# Patient Record
Sex: Male | Born: 1980 | Race: Black or African American | Hispanic: No | Marital: Married | Smoking: Never smoker
Health system: Southern US, Community
[De-identification: ages and names within clinical notes are randomized; demographics above are authoritative.]

---

## 2017-09-24 ENCOUNTER — Other Ambulatory Visit: Payer: Self-pay

## 2017-09-24 ENCOUNTER — Encounter (HOSPITAL_COMMUNITY): Payer: Self-pay | Admitting: Emergency Medicine

## 2017-09-24 ENCOUNTER — Ambulatory Visit (HOSPITAL_COMMUNITY)
Admission: EM | Admit: 2017-09-24 | Discharge: 2017-09-24 | Disposition: A | Discharge status: Home / Self Care | Payer: Medicaid Other | Attending: Family Medicine | Admitting: Family Medicine

## 2017-09-24 DIAGNOSIS — R51 Headache: Secondary | ICD-10-CM | POA: Diagnosis not present

## 2017-09-24 DIAGNOSIS — R1011 Right upper quadrant pain: Secondary | ICD-10-CM

## 2017-09-24 DIAGNOSIS — G8929 Other chronic pain: Secondary | ICD-10-CM | POA: Insufficient documentation

## 2017-09-24 DIAGNOSIS — R109 Unspecified abdominal pain: Secondary | ICD-10-CM | POA: Diagnosis present

## 2017-09-24 LAB — COMPREHENSIVE METABOLIC PANEL
ALBUMIN: 4.6 g/dL (ref 3.5–5.0)
ALT: 45 U/L — ABNORMAL HIGH (ref 0–44)
AST: 37 U/L (ref 15–41)
Alkaline Phosphatase: 66 U/L (ref 38–126)
Anion gap: 9 (ref 5–15)
BUN: 8 mg/dL (ref 6–20)
CHLORIDE: 99 mmol/L (ref 98–111)
CO2: 30 mmol/L (ref 22–32)
Calcium: 9.7 mg/dL (ref 8.9–10.3)
Creatinine, Ser: 0.89 mg/dL (ref 0.61–1.24)
GFR calc Af Amer: >60 mL/min (ref >60)
GFR calc non Af Amer: >60 mL/min (ref >60)
GLUCOSE: 111 mg/dL — AB (ref 70–99)
POTASSIUM: 4.2 mmol/L (ref 3.5–5.1)
SODIUM: 138 mmol/L (ref 135–145)
Total Bilirubin: 1 mg/dL (ref 0.3–1.2)
Total Protein: 7.6 g/dL (ref 6.5–8.1)

## 2017-09-24 LAB — CBC
HCT: 48.7 % (ref 39.0–52.0)
Hemoglobin: 16.5 g/dL (ref 13.0–17.0)
MCH: 28.8 pg (ref 26.0–34.0)
MCHC: 33.9 g/dL (ref 30.0–36.0)
MCV: 85.1 fL (ref 78.0–100.0)
PLATELETS: 319 10*3/uL (ref 150–400)
RBC: 5.72 MIL/uL (ref 4.22–5.81)
RDW: 12.3 % (ref 11.5–15.5)
WBC: 7.5 10*3/uL (ref 4.0–10.5)

## 2017-09-24 LAB — POCT URINALYSIS DIP (DEVICE)
BILIRUBIN URINE: NEGATIVE
Glucose, UA: NEGATIVE mg/dL
HGB URINE DIPSTICK: NEGATIVE
KETONES UR: NEGATIVE mg/dL
Leukocytes, UA: NEGATIVE
Nitrite: NEGATIVE
Protein, ur: NEGATIVE mg/dL
SPECIFIC GRAVITY, URINE: 1.015 (ref 1.005–1.030)
Urobilinogen, UA: 0.2 mg/dL (ref 0.0–1.0)
pH: 7.5 (ref 5.0–8.0)

## 2017-09-24 LAB — LIPASE, BLOOD: LIPASE: 36 U/L (ref 11–51)

## 2017-09-24 MED ORDER — OMEPRAZOLE 20 MG PO CPDR: 20.0000 mg | DELAYED_RELEASE_CAPSULE | Freq: Every day | ORAL | 0 refills | Status: DC

## 2017-09-24 NOTE — ED Provider Notes (Signed)
[place]    CSN: [id number] Arrival date & time: 09/24/17  1528     History   Chief Complaint Chief Complaint  Patient presents with  . Abdominal Pain    HPI Patrick Reed is a 37 y.o. male no significant past medical history presenting today for evaluation of right-sided abdominal pain.  Patient states that he has had this abdominal pain off and on for the past 10 years while he was in [place], recently emigrated over here 1 month ago, over this past month he has had worsening/more persistent pain.  Denies associated nausea or vomiting.  Denies changes in bowel movements.  Eating and drinking like normally.  Denies any shortness of breath or chest pain.  Noticed the pain worsening with movement.  States that he feels the pain underneath his rib cage.  Denies coughing or recent illness.  Occasionally will have headaches.  Denies urinary symptoms of dysuria or increased frequency.  He states in [place] they told him he had a fatty liver, but he is unsure about this.  HPI  History reviewed. No pertinent past medical history.  There are no active problems to display for this patient.   History reviewed. No pertinent surgical history.     Home Medications    Prior to Admission medications   Medication Sig Start Date End Date Taking? Authorizing Provider  omeprazole (PRILOSEC) 20 MG capsule Take 1 capsule (20 mg total) by mouth daily for 14 days. 09/24/17 10/08/17  Anay Rathe, Elesa Hacker, PA-C    Family History History reviewed. No pertinent family history.  Social History Social History   Tobacco Use  . Smoking status: Never Smoker  . Smokeless tobacco: Never Used  Substance Use Topics  . Alcohol use: Never    Frequency: Never  . Drug use: Never     Allergies   Patient has no known allergies.   Review of Systems Review of Systems  Constitutional: Negative for activity change, appetite change, chills, fatigue and fever.  HENT: Negative for congestion, ear  pain, rhinorrhea, sinus pressure, sore throat and trouble swallowing.   Eyes: Negative for discharge and redness.  Respiratory: Negative for cough, chest tightness and shortness of breath.   Cardiovascular: Negative for chest pain.  Gastrointestinal: Positive for abdominal pain. Negative for diarrhea, nausea and vomiting.  Genitourinary: Negative for dysuria, frequency, hematuria and urgency.  Musculoskeletal: Negative for myalgias.  Skin: Negative for rash.  Neurological: Positive for headaches. Negative for dizziness and light-headedness.     Physical Exam Triage Vital Signs ED Triage Vitals  Enc Vitals Group     BP 09/24/17 1544 133/77     Pulse Rate 09/24/17 1544 88     Resp 09/24/17 1544 18     Temp 09/24/17 1544 98 F (36.7 C)     Temp Source 09/24/17 1544 Oral     SpO2 09/24/17 1544 100 %     Weight --      Height --      Head Circumference --      Peak Flow --      Pain Score 09/24/17 1543 4     Pain Loc --      Pain Edu? --      Excl. in [place]? --    No data found.  Updated Vital Signs BP 133/77 (BP Location: Left Arm)   Pulse 88   Temp 98 F (36.7 C) (Oral)   Resp 18   SpO2 100%   Visual Acuity Right  Eye Distance:   Left Eye Distance:   Bilateral Distance:    Right Eye Near:   Left Eye Near:    Bilateral Near:     Physical Exam  Constitutional: He appears well-developed and well-nourished.  HENT:  Head: Normocephalic and atraumatic.  Mouth/Throat: Oropharynx is clear and moist.  Eyes: Pupils are equal, round, and reactive to light. Conjunctivae and EOM are normal.  Neck: Neck supple.  Cardiovascular: Normal rate and regular rhythm.  No murmur heard. Pulmonary/Chest: Effort normal and breath sounds normal. No respiratory distress.  Abdominal: Soft. There is no tenderness.  Mild tenderness to right flank/right upper quadrant, negative Murphy's, negative McBurney's, negative Rovsing, negative rebound.  Musculoskeletal: He exhibits no edema.    Neurological: He is alert.  Skin: Skin is warm and dry.  Psychiatric: He has a normal mood and affect.  Nursing note and vitals reviewed.    UC Treatments / Results  Labs (all labs ordered are listed, but only abnormal results are displayed) Labs Reviewed  CBC  COMPREHENSIVE METABOLIC PANEL  LIPASE, BLOOD  HEPATITIS PANEL, ACUTE  POCT URINALYSIS DIP (DEVICE)    EKG None  Radiology No results found.  Procedures Procedures (including critical care time)  Medications Ordered in UC Medications - No data to display  Initial Impression / Assessment and Plan / UC Course  I have reviewed the triage vital signs and the nursing notes.  Pertinent labs & imaging results that were available during my care of the patient were reviewed by me and considered in my medical decision making (see chart for details).     Patient with chronic abdominal pain, vital signs stable, exam unremarkable.  Possible liver/gallbladder etiology.  UA negative.  Will draw blood to check for LFTs, lipase.  Discussed with patient about establishing care with a PCP for further evaluation and imaging of this area.  Will call patient with lab results if abnormal.  We will do a trial of Prilosec for the next couple weeks to see if pain is reflux related.  Discussed strict return precautions. Patient verbalized understanding and is agreeable with plan.  Final Clinical Impressions(s) / UC Diagnoses   Final diagnoses:  Right upper quadrant abdominal pain     Discharge Instructions     Please begin prilosec daily for the next 2 weeks  Follow up with community health and wellness or gastroenterology for further evaluation  Return if pain worsening, developing nausea, and vomiting, changes in bowels, fever    ED Prescriptions    Medication Sig Dispense Auth. Provider   omeprazole (PRILOSEC) 20 MG capsule Take 1 capsule (20 mg total) by mouth daily for 14 days. 14 capsule Suellyn Meenan C, PA-C      Controlled Substance Prescriptions [place] Controlled Substance Registry consulted? Not Applicable   Janith Lima, [place] 09/24/17 1721

## 2017-09-24 NOTE — Discharge Instructions (Addendum)
Please begin prilosec daily for the next 2 weeks  Follow up with community health and wellness or gastroenterology for further evaluation  Return if pain worsening, developing nausea, and vomiting, changes in bowels, fever

## 2017-09-24 NOTE — ED Triage Notes (Signed)
The patient presented to the [facility] with a complaint of right side abdominal pain x 1 month.

## 2017-09-25 LAB — HEPATITIS PANEL, ACUTE
HCV Ab: <0.1 s/co ratio (ref 0.0–0.9)
HEP B S AG: NEGATIVE
Hep A IgM: NEGATIVE
Hep B C IgM: NEGATIVE

## 2017-09-30 ENCOUNTER — Telehealth (HOSPITAL_COMMUNITY): Payer: Self-pay

## 2017-09-30 NOTE — Telephone Encounter (Signed)
Per [facility], no further instructions continue with plan of care to establish PCP. Labs are normal. Attempted to reach patient. No answer at this time.

## 2017-10-24 DIAGNOSIS — Z0000 Encounter for general adult medical examination without abnormal findings: Secondary | ICD-10-CM | POA: Diagnosis not present

## 2017-10-24 DIAGNOSIS — Z23 Encounter for immunization: Secondary | ICD-10-CM | POA: Diagnosis not present

## 2017-10-24 DIAGNOSIS — Z049 Encounter for examination and observation for unspecified reason: Secondary | ICD-10-CM | POA: Diagnosis not present

## 2017-10-24 DIAGNOSIS — Z3009 Encounter for other general counseling and advice on contraception: Secondary | ICD-10-CM | POA: Diagnosis not present

## 2017-10-24 DIAGNOSIS — Z206 Contact with and (suspected) exposure to human immunodeficiency virus [HIV]: Secondary | ICD-10-CM | POA: Diagnosis not present

## 2017-10-24 DIAGNOSIS — Z1388 Encounter for screening for disorder due to exposure to contaminants: Secondary | ICD-10-CM | POA: Diagnosis not present

## 2017-10-24 DIAGNOSIS — Z111 Encounter for screening for respiratory tuberculosis: Secondary | ICD-10-CM | POA: Diagnosis not present

## 2017-10-24 DIAGNOSIS — Z0289 Encounter for other administrative examinations: Secondary | ICD-10-CM | POA: Diagnosis not present

## 2017-10-24 DIAGNOSIS — Z0389 Encounter for observation for other suspected diseases and conditions ruled out: Secondary | ICD-10-CM | POA: Diagnosis not present

## 2017-10-29 ENCOUNTER — Encounter: Payer: Self-pay | Admitting: Physician Assistant

## 2017-10-30 ENCOUNTER — Other Ambulatory Visit: Payer: Self-pay

## 2017-10-30 ENCOUNTER — Emergency Department (HOSPITAL_COMMUNITY)
Admission: EM | Admit: 2017-10-30 | Discharge: 2017-10-30 | Disposition: A | Discharge status: Home / Self Care | Payer: Medicaid Other | Attending: Emergency Medicine | Admitting: Emergency Medicine

## 2017-10-30 DIAGNOSIS — R1031 Right lower quadrant pain: Secondary | ICD-10-CM | POA: Insufficient documentation

## 2017-10-30 DIAGNOSIS — G8929 Other chronic pain: Secondary | ICD-10-CM | POA: Diagnosis not present

## 2017-10-30 DIAGNOSIS — R109 Unspecified abdominal pain: Secondary | ICD-10-CM

## 2017-10-30 LAB — CBC
HEMATOCRIT: 47.8 % (ref 39.0–52.0)
Hemoglobin: 15.8 g/dL (ref 13.0–17.0)
MCH: 28.6 pg (ref 26.0–34.0)
MCHC: 33.1 g/dL (ref 30.0–36.0)
MCV: 86.4 fL (ref 78.0–100.0)
Platelets: 347 10*3/uL (ref 150–400)
RBC: 5.53 MIL/uL (ref 4.22–5.81)
RDW: 12.3 % (ref 11.5–15.5)
WBC: 8.1 10*3/uL (ref 4.0–10.5)

## 2017-10-30 LAB — COMPREHENSIVE METABOLIC PANEL
ALT: 67 U/L — ABNORMAL HIGH (ref 0–44)
AST: 41 U/L (ref 15–41)
Albumin: 4.5 g/dL (ref 3.5–5.0)
Alkaline Phosphatase: 74 U/L (ref 38–126)
Anion gap: 8 (ref 5–15)
BILIRUBIN TOTAL: 0.8 mg/dL (ref 0.3–1.2)
BUN: 10 mg/dL (ref 6–20)
CO2: 28 mmol/L (ref 22–32)
Calcium: 9.6 mg/dL (ref 8.9–10.3)
Chloride: 102 mmol/L (ref 98–111)
Creatinine, Ser: 0.82 mg/dL (ref 0.61–1.24)
GFR calc Af Amer: >60 mL/min (ref >60)
GFR calc non Af Amer: >60 mL/min (ref >60)
Glucose, Bld: 99 mg/dL (ref 70–99)
POTASSIUM: 4.3 mmol/L (ref 3.5–5.1)
Sodium: 138 mmol/L (ref 135–145)
TOTAL PROTEIN: 7.7 g/dL (ref 6.5–8.1)

## 2017-10-30 LAB — URINALYSIS, ROUTINE W REFLEX MICROSCOPIC
BILIRUBIN URINE: NEGATIVE
GLUCOSE, UA: NEGATIVE mg/dL
HGB URINE DIPSTICK: NEGATIVE
KETONES UR: NEGATIVE mg/dL
Leukocytes, UA: NEGATIVE
Nitrite: NEGATIVE
PH: 6 (ref 5.0–8.0)
Protein, ur: NEGATIVE mg/dL
Specific Gravity, Urine: 1.024 (ref 1.005–1.030)

## 2017-10-30 LAB — LIPASE, BLOOD: Lipase: 44 U/L (ref 11–51)

## 2017-10-30 NOTE — Discharge Instructions (Addendum)
Please read attached information. If you experience any new or worsening signs or symptoms please return to the emergency room for evaluation. Please follow-up with your primary care provider or specialist as discussed.  °

## 2017-10-30 NOTE — Congregational Nurse Program (Signed)
Congregational Nurse Program Note  Date of Encounter: 10/30/2017  Past Medical History: No past medical history on file.  Encounter Details: CNP Questionnaire - 10/30/17 1145      Questionnaire   Patient Status  Refugee    Race  African    Location Patient Served At  [place]  Medicaid    Uninsured  Not Applicable    Food  No food insecurities    Housing/Utilities  Yes, have permanent housing    Transportation  Yes, need transportation assistance    Interpersonal Safety  Yes, feel physically and emotionally safe where you currently live    Medication  No medication insecurities    Medical Provider  No    Referrals  Primary Care Provider/Clinic;Emergency Department    ED Visit Averted  Not Applicable    Life-Saving Intervention Made  Not Applicable       Initial encounter for this African man from [place] in [place]. two months with family of spouse and children. Visited nurse office at [facility] during daily English class today seeking help with ongoing abdominal pain progressively worse since Urgent Care visit on 09/24/17. Capable of reporting concerns in [place]. Stressed appearance with pain. Describes pain at level 6-8 constantly and during sleep. Pain radiates from right lower abdomen to upper right abdomen and around to lower back. Unable to stand long periods without severe pain. Experiencing frequent gas and belching. Denies other problems. Awaiting PCP appointment. Scheduled for evaluation by PA at [facility] 11/20/17 at 9:45 am.

## 2017-10-30 NOTE — ED Triage Notes (Signed)
Pt to ER for evaluation of right lower abdominal pain x "years" states has become worse. States no difficulty with urination or BM. Pt in NAD.

## 2017-10-30 NOTE — ED Provider Notes (Signed)
[place] EMERGENCY DEPARTMENT Provider Note   CSN: [id number] Arrival date & time: 10/30/17  1251     History   Chief Complaint Chief Complaint  Patient presents with  . Abdominal Pain    HPI Patrick Reed is a 37 y.o. male.  HPI   37 year old male presents today with complaints of chronic abdominal pain. Patient notes several years of right lower quadrant abdominal pain. Patient notes that he has been seen several times for this with endoscopy ultrasound and laboratory analysis. Patient notes he has had elevations in his liver function tests and was told he had fatty liver disease. Patient notes that symptoms have persisted, continued in characteristics withburning pain in the right lower quadrant. Patient denies any nausea vomiting or fever, denies any upper abdominal pain, denies any  Urinary or bowel complaints, no dark or bloody stools. Patient has a follow-up with primary care next week.     No past medical history on file.  There are no active problems to display for this patient.   No past surgical history on file.      Home Medications    Prior to Admission medications   Medication Sig Start Date End Date Taking? Authorizing Provider  omeprazole (PRILOSEC) 20 MG capsule Take 1 capsule (20 mg total) by mouth daily for 14 days. 09/24/17 10/30/17 Yes Wieters, Elesa Hacker, PA-C    Family History No family history on file.  Social History Social History   Tobacco Use  . Smoking status: Never Smoker  . Smokeless tobacco: Never Used  Substance Use Topics  . Alcohol use: Never    Frequency: Never  . Drug use: Never     Allergies   Patient has no known allergies.   Review of Systems Review of Systems  All other systems reviewed and are negative.  Physical Exam Updated Vital Signs BP 140/90 (BP Location: Right Arm)   Pulse 62   Temp 98.4 F (36.9 C) (Oral)   Resp 16   SpO2 99%   Physical Exam  Constitutional: He is oriented to  person, place, and time. He appears well-developed and well-nourished.  HENT:  Head: Normocephalic and atraumatic.  Eyes: Pupils are equal, round, and reactive to light. Conjunctivae are normal. Right eye exhibits no discharge. Left eye exhibits no discharge. No scleral icterus.  Neck: Normal range of motion. No JVD present. No tracheal deviation present.  Pulmonary/Chest: Effort normal. No stridor.  Abdominal: Soft. He exhibits no distension and no mass. There is tenderness. There is no rebound and no guarding. No hernia.  TTP right lower quadrant   Neurological: He is alert and oriented to person, place, and time. Coordination normal.  Psychiatric: He has a normal mood and affect. His behavior is normal. Judgment and thought content normal.  Nursing note and vitals reviewed.    ED Treatments / Results  Labs (all labs ordered are listed, but only abnormal results are displayed) Labs Reviewed  COMPREHENSIVE METABOLIC PANEL - Abnormal; Notable for the following components:      Result Value   ALT 67 (*)    All other components within normal limits  LIPASE, BLOOD  CBC  URINALYSIS, ROUTINE W REFLEX MICROSCOPIC    EKG None  Radiology No results found.  Procedures Procedures (including critical care time)  Medications Ordered in ED Medications - No data to display   Initial Impression / Assessment and Plan / ED Course  I have reviewed the triage vital signs and the nursing  notes.  Pertinent labs & imaging results that were available during my care of the patient were reviewed by me and considered in my medical decision making (see chart for details).     Labs: lipase, CMP, CBC, urinalysis  Imaging:  Consults:  Therapeutics:  Discharge Meds:   Assessment/Plan: 37 year old male presents today with chronic lower abdominal pain. Uncertain etiology at this time. He has no signs of infectious etiology was suspicion for acute appendicitis, diverticulitis or any other  acute life-threatening intra-abdominal pathology. Patient is on he had significant workup in the past, I will refer him to gastroenterology for ongoing symptoms. Patient does have follow-up with primary care next week. Return precautions given. Patient verbalized understanding and agreement to today's plan.      Final Clinical Impressions(s) / ED Diagnoses   Final diagnoses:  Chronic abdominal pain    ED Discharge Orders    None       Francee Gentile 10/30/17 1635    Hayden Rasmussen, MD 10/31/17 [phone]

## 2017-11-05 ENCOUNTER — Ambulatory Visit (INDEPENDENT_AMBULATORY_CARE_PROVIDER_SITE_OTHER): Payer: Medicaid Other | Admitting: Family Medicine

## 2017-11-05 ENCOUNTER — Other Ambulatory Visit: Payer: Self-pay

## 2017-11-05 VITALS — BP 122/86 | HR 70 | Temp 98.2°F | Ht 66.0 in | Wt 160.0 lb

## 2017-11-05 DIAGNOSIS — H6123 Impacted cerumen, bilateral: Secondary | ICD-10-CM | POA: Diagnosis not present

## 2017-11-05 DIAGNOSIS — Z603 Acculturation difficulty: Secondary | ICD-10-CM | POA: Diagnosis not present

## 2017-11-05 DIAGNOSIS — Z0289 Encounter for other administrative examinations: Secondary | ICD-10-CM | POA: Diagnosis not present

## 2017-11-05 DIAGNOSIS — R1084 Generalized abdominal pain: Secondary | ICD-10-CM | POA: Diagnosis not present

## 2017-11-05 MED ORDER — CARBAMIDE PEROXIDE 6.5 % OT SOLN: 5.0000 [drp] | Freq: Two times a day (BID) | OTIC | 0 refills | Status: DC

## 2017-11-05 NOTE — Patient Instructions (Signed)
It was a pleasure to see you today! Thank you for choosing [facility] for your primary care.   Please come back to see [place] for your followup appt.    You can use these ear drops we prescribed to try and soften your ear wax, please do not stick any hard objects in your ear.  We will try to get your medical records from the health clinic.  Please call us next week and ask the office staff if  has declared which medicaid plans they will take.    Please bring all your medications to every doctors visit   Please check-out at the front desk before leaving the clinic.     Best,  Dr. Sherene Sires FAMILY MEDICINE RESIDENT - PGY2 11/05/2017 4:05 PM

## 2017-11-05 NOTE — Progress Notes (Signed)
Patient declined interpretor.  [place] Patient Visit  HPI:  Patient presents to [facility] today for a new patient appointment to establish general primary care, also to discuss right sided abdominal.  Will see GI on 28th  Abdominal:occasional 10/10, if he sleeps on his right side it sometimes feels better.  Reports a prior endoscopy and 2 CT's (pre- 2011) all with no findings other than fatty liver that he says was treated with a medicine he can't remember  ROS: otherwise see HPI  Past Medical Hx:  -omeprazole for 2wks, did not help rigth sided belly pain.  Was taking a medicine he doesn't remember the name of in [place] but it didn't help much either.  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  no  - if yes:  Are you having bad dreams about your experience? Used to, but now focuses on other thins     Do you feel "jumpy" or "nervous?" no      Do you feel that the experience is happening again? no     Are you "super alert" or watchful?  no  Preventative Care History: -Seen at health department?: yes  PHYSICAL EXAM: BP 122/86   Pulse 70   Temp 98.2 F (36.8 C) (Oral)   Ht 5\' 6"  (1.676 m)   Wt 160 lb (72.6 kg)   SpO2 98%   BMI 25.82 kg/m  Gen: well  appearing, NAD HEENT: cerumen acumulation, no epistaxis, no edema/lesions to throat Heart: RRR, no murmur noted Lungs: CTAB, no wheezes/crackles, no IWB Abdomen: no splenomegaly noted, mild tenderness to palpation, moreso on right side Skin:  No unhealed wounds  MSK: no deficit noted Neuro: grossly intact Psych: appropriate thought process  Dr. Sherene Sires  Examined and interviewed with Dr. Mingo Amber

## 2017-11-06 ENCOUNTER — Encounter: Payer: Self-pay | Admitting: Family Medicine

## 2017-11-06 NOTE — Assessment & Plan Note (Signed)
Abdominal:occasional 10/10, if he sleeps on his right side it sometimes feels better.  Reports a prior endoscopy and 2 CT's (pre- 2011) all with no findings other than fatty liver that he says was treated with a medicine he can't remember   Will obtain refugee screening labs before determining additional workup

## 2017-11-06 NOTE — Assessment & Plan Note (Signed)
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

## 2017-11-06 NOTE — Assessment & Plan Note (Signed)
Impaction on Right hand side.  Present on Left.  Plan will be to use Debrox x 4 weeks and return for possible irrigation at that time.

## 2017-11-20 ENCOUNTER — Ambulatory Visit: Payer: Medicaid Other | Admitting: Physician Assistant

## 2017-11-22 ENCOUNTER — Ambulatory Visit: Payer: Medicaid Other | Admitting: Physician Assistant

## 2017-11-22 ENCOUNTER — Encounter: Payer: Self-pay | Admitting: Physician Assistant

## 2017-11-22 VITALS — BP 110/80 | HR 70 | Ht 66.0 in | Wt 159.0 lb

## 2017-11-22 DIAGNOSIS — R1084 Generalized abdominal pain: Secondary | ICD-10-CM

## 2017-11-22 DIAGNOSIS — R143 Flatulence: Secondary | ICD-10-CM

## 2017-11-22 MED ORDER — DICYCLOMINE HCL 20 MG PO TABS: 20.0000 mg | ORAL_TABLET | Freq: Four times a day (QID) | ORAL | 0 refills | Status: DC

## 2017-11-22 MED ORDER — NA SULFATE-K SULFATE-MG SULF 17.5-3.13-1.6 GM/177ML PO SOLN: ORAL | 0 refills | Status: DC

## 2017-11-22 NOTE — Patient Instructions (Addendum)
If you are age 39 or older, your body mass index should be between 23-30. Your Body mass index is 25.66 kg/m. If this is out of the aforementioned range listed, please consider follow up with your Primary Care Provider.  If you are age 40 or younger, your body mass index should be between 19-25. Your Body mass index is 25.66 kg/m. If this is out of the aformentioned range listed, please consider follow up with your Primary Care Provider.   You have been scheduled for a colonoscopy. Please follow written instructions given to you at your visit today.  Please pick up your prep supplies at the pharmacy within the next 1-3 days. If you use inhalers (even only as needed), please bring them with you on the day of your procedure. Your physician has requested that you go to www.startemmi.com and enter the access code given to you at your visit today. This web site gives a general overview about your procedure. However, you should still follow specific instructions given to you by our office regarding your preparation for the procedure.  We have sent the following medications to your pharmacy for you to pick up at your convenience: [place] have been scheduled for a CT scan of the abdomen and pelvis at [facility] ([address]---this is in the same building as [facility]).   You are scheduled on 12/05/17 at 1 pm. You should arrive 15 minutes prior to your appointment time for registration. Please follow the written instructions below on the day of your exam:  WARNING: IF YOU ARE ALLERGIC TO IODINE/X-RAY DYE, PLEASE NOTIFY RADIOLOGY IMMEDIATELY AT [phone]! YOU WILL BE GIVEN A 13 HOUR PREMEDICATION PREP.  1) Do not eat after 9 am (4 hours prior to your test) 2) You have been given 2 bottles of oral contrast to drink. The solution may taste better if refrigerated, but do NOT add ice or any other liquid to this solution. Shake well before drinking.    Drink 1 bottle  of contrast @ 11 am (2 hours prior to your exam)  Drink 1 bottle of contrast @ 12 noon (1 hour prior to your exam)  You may take any medications as prescribed with a small amount of water except for the following: Metformin, Glucophage, Glucovance, Avandamet, Riomet, Fortamet, Actoplus Met, Janumet, Glumetza or Metaglip. The above medications must be held the day of the exam AND 48 hours after the exam.  The purpose of you drinking the oral contrast is to aid in the visualization of your intestinal tract. The contrast solution may cause some diarrhea. Before your exam is started, you will be given a small amount of fluid to drink. Depending on your individual set of symptoms, you may also receive an intravenous injection of x-ray contrast/dye. Plan on being at [facility] for 30 minutes or longer, depending on the type of exam you are having performed.  This test typically takes 30-45 minutes to complete.  If you have any questions regarding your exam or if you need to reschedule, you may call the CT department at [phone] between the hours of 8:00 am and 5:00 pm, Monday-Friday.  ________________________________________________________________________  Thank you for choosing me and [facility].   Ellouise Newer, PA-C

## 2017-11-22 NOTE — Progress Notes (Signed)
Chief Complaint: Abdominal pain  HPI:     Mr. Ikard is a 37 year old male from the [place], with a past medical history as listed below, who presents to clinic today with a complaint of abdominal pain.    10/30/2017 ED visit for chronic abdominal pain.  Describes several years of burning right lower quadrant abdominal pain, apparently also described work-up with endoscopy, ultrasound and labs.  Vague history of elevated LFTs with fatty liver disease.  CBC, lipase and urinalysis normal.  ALT mildly elevated at 67.  CMP otherwise normal.  Scribes work-up 06/12/2009.    11/05/2017 office visit PCP.  Discussed abdominal pain, apparently they were trying to obtain refugee screening labs before determining additional work-up.    Today, patient explains that he has lived here for a couple of months now and has been seen at various establishments for his chronic abdominal pain but "no one has really done a work-up".  He tells me that he has had abdominal pain for at least 10 years.  Describes this as a burning sensation rated as a 9-10/10 that is constant.  This is typically in his right lower quadrant but may sometimes radiate up to his right upper quadrant.  It always seems to stay on the right side of his abdomen but can sometimes radiate into his back.  This disturbs his sleep but sometimes he can get a little bit of rest if he lays on that side.  Sometimes sitting for long periods of time or standing can make it worse.  Nothing seems to make it better.  He was tried on Omeprazole for 2 weeks but this did not help at all.  Previously he has had work-up with 2 CT scans in [place] and an EGD from what it sounds like about 5 years ago.  He tells me these were all normal.  Patient tells me this affects his daily living and he has to just "work through the pain".  Does have occasional constipation.  Associated symptoms include increased gas.  This pain does not change with position.    Denies fever, chills, weight  loss, change in bowel habits, heartburn or reflux.  Past medical history: Chronic abdominal pain  Current Outpatient Medications  Medication Sig Dispense Refill  . dicyclomine (BENTYL) 20 MG tablet Take 1 tablet (20 mg total) by mouth 4 (four) times daily. Take 20-30 minutes before meals and bedtime 120 tablet 0  . Na Sulfate-K Sulfate-Mg Sulf 17.5-3.13-1.6 GM/177ML SOLN Suprep-Use as directed 354 mL 0   No current facility-administered medications for this visit.     Allergies as of 11/22/2017  . (No Known Allergies)    Family History  Family history unknown: Yes    Social History   Socioeconomic History  . Marital status: Married    Spouse name: Not on file  . Number of children: Not on file  . Years of education: Not on file  . Highest education level: Not on file  Occupational History  . Occupation: unemployed  Social Needs  . Financial resource strain: Not on file  . Food insecurity:    Worry: Not on file    Inability: Not on file  . Transportation needs:    Medical: Not on file    Non-medical: Not on file  Tobacco Use  . Smoking status: Never Smoker  . Smokeless tobacco: Never Used  Substance and Sexual Activity  . Alcohol use: Never    Frequency: Never  . Drug use: Never  .  Sexual activity: Never  Lifestyle  . Physical activity:    Days per week: Not on file    Minutes per session: Not on file  . Stress: Not on file  Relationships  . Social connections:    Talks on phone: Not on file    Gets together: Not on file    Attends religious service: Not on file    Active member of club or organization: Not on file    Attends meetings of clubs or organizations: Not on file    Relationship status: Not on file  . Intimate partner violence:    Fear of current or ex partner: Not on file    Emotionally abused: Not on file    Physically abused: Not on file    Forced sexual activity: Not on file  Other Topics Concern  . Not on file  Social History Narrative     From [place] was a [place] copy. Has lived 2 months in [place].     Review of Systems:    Constitutional: No weight loss, fever or chills Skin: No rash  Cardiovascular: No chest pain  Respiratory: No SOB Gastrointestinal: See HPI and otherwise negative Genitourinary: No dysuria  Neurological: No headache, dizziness or syncope Musculoskeletal: No new muscle or joint pain Hematologic: No bleeding  Psychiatric: No history of depression or anxiety   Physical Exam:  Vital signs: BP 110/80   Pulse 70   Ht 5\' 6"  (1.676 m)   Wt 159 lb (72.1 kg)   SpO2 100%   BMI 25.66 kg/m   Constitutional:   Pleasant AA male appears to be in NAD, Well developed, Well nourished, alert and cooperative Head:  Normocephalic and atraumatic. Eyes:   PEERL, EOMI. No icterus. Conjunctiva pink. Ears:  Normal auditory acuity. Neck:  Supple Throat: Oral cavity and pharynx without inflammation, swelling or lesion.  Respiratory: Respirations even and unlabored. Lungs clear to auscultation bilaterally.   No wheezes, crackles, or rhonchi.  Cardiovascular: Normal S1, S2. No MRG. Regular rate and rhythm. No peripheral edema, cyanosis or pallor.  Gastrointestinal:  Soft, nondistended, moderate TTP on right side of abdomen, worse in right lower quadrant, no rebound or guarding. Normal bowel sounds. No appreciable masses or hepatomegaly. Rectal:  Not performed.  Msk:  Symmetrical without gross deformities. Without edema, no deformity or joint abnormality.  Neurologic:  Alert and  oriented x4;  grossly normal neurologically.  Skin:   Dry and intact without significant lesions or rashes. Psychiatric: Demonstrates good judgement and reason without abnormal affect or behaviors.  RELEVANT LABS AND IMAGING: CBC    Component Value Date/Time   WBC 8.1 10/30/2017 1307   RBC 5.53 10/30/2017 1307   HGB 15.8 10/30/2017 1307   HCT 47.8 10/30/2017 1307   PLT 347 10/30/2017 1307   MCV 86.4 10/30/2017  1307   MCH 28.6 10/30/2017 1307   MCHC 33.1 10/30/2017 1307   RDW 12.3 10/30/2017 1307    CMP     Component Value Date/Time   NA 138 10/30/2017 1307   K 4.3 10/30/2017 1307   CL 102 10/30/2017 1307   CO2 28 10/30/2017 1307   GLUCOSE 99 10/30/2017 1307   BUN 10 10/30/2017 1307   CREATININE 0.82 10/30/2017 1307   CALCIUM 9.6 10/30/2017 1307   PROT 7.7 10/30/2017 1307   ALBUMIN 4.5 10/30/2017 1307   AST 41 10/30/2017 1307   ALT 67 (H) 10/30/2017 1307   ALKPHOS 74 10/30/2017 1307   BILITOT 0.8  10/30/2017 1307   GFRNONAA >60 10/30/2017 1307   GFRAA >60 10/30/2017 1307    Assessment: 1.  Chronic abdominal pain: For 10+ years, 5+ years ago had work-up in [place] with 2 CTs which showed "nothing" as well as an EGD which did not show anything, patient continues to be a 9-10/10 pain at all times, also excessive gas; consider IBS versus colitis versus musculoskeletal cause versus other  Plan: 1.  Ordered a CT of the abdomen and pelvis for further evaluation. 2.  Also scheduled patient for a colonoscopy with Dr. Havery Moros in [facility].  Did discuss risk, benefits, limitations and alternatives and patient agrees to proceed.  Did schedule this at least 3-4 weeks out so that we can obtain results from CT as above in case this picks up something and we do not need to have a colonoscopy.  Discussed this with the patient. 3.  Prescribed Bentyl 20 mg 4 times daily, 20 -30 minutes before meals and at bedtime.  Instructed the patient to try this for at least 2 weeks, if this does not help at all he can discontinue. 4.  Patient to follow in clinic per recommendations after imaging above.  Ellouise Newer, PA-C [facility] 11/22/2017, 9:10 AM  Cc: Sherene Sires, DO

## 2017-11-24 NOTE — Progress Notes (Signed)
Agree with assessment and plan as outlined.  

## 2017-12-05 ENCOUNTER — Inpatient Hospital Stay: Admission: RE | Admit: 2017-12-05 | Payer: Medicaid Other | Source: Ambulatory Visit

## 2017-12-09 ENCOUNTER — Ambulatory Visit: Payer: Medicaid Other | Admitting: Family Medicine

## 2017-12-09 ENCOUNTER — Ambulatory Visit (INDEPENDENT_AMBULATORY_CARE_PROVIDER_SITE_OTHER)
Admission: RE | Admit: 2017-12-09 | Discharge: 2017-12-09 | Disposition: A | Discharge status: Home / Self Care | Payer: Medicaid Other | Source: Ambulatory Visit | Attending: Physician Assistant | Admitting: Physician Assistant

## 2017-12-09 DIAGNOSIS — R1084 Generalized abdominal pain: Secondary | ICD-10-CM

## 2017-12-09 MED ORDER — IOPAMIDOL (ISOVUE-300) INJECTION 61%
100.0000 mL | Freq: Once | INTRAVENOUS | Status: AC | PRN
  Administered 2017-12-09: 100 mL via INTRAVENOUS

## 2017-12-18 ENCOUNTER — Other Ambulatory Visit: Payer: Self-pay

## 2017-12-18 ENCOUNTER — Encounter: Payer: Self-pay | Admitting: Family Medicine

## 2017-12-18 ENCOUNTER — Ambulatory Visit: Payer: Medicaid Other | Admitting: Family Medicine

## 2017-12-18 DIAGNOSIS — R1084 Generalized abdominal pain: Secondary | ICD-10-CM

## 2017-12-18 NOTE — Patient Instructions (Addendum)
It was great to meet you. I think proceeding with the colonoscopy is the next step and follow up with your PCP in a month (after your colonoscopy)

## 2017-12-20 NOTE — Assessment & Plan Note (Signed)
Long-standing chronic abdominal pain.  He has been seen by gastroenterology and has colonoscopy scheduled.  I would recommend he continue the Bentyl and follow-up with colonoscopy.  We spoke with his PCP, Dr. Criss Rosales today he was also in the office.  He stepped in for moment to see the patient.  We have set follow-up with Dr. Criss Rosales.

## 2017-12-20 NOTE — Progress Notes (Signed)
    CHIEF COMPLAINT / HPI: Follow-up chronic abdominal pain.  Has been seen by the gastroenterologist and they started him on Bentyl.  He says it is helping some.  They have set him up for a colonoscopy and that is not scheduled until next month.  He wonders if there is something else we can to do additionally.  REVIEW OF SYSTEMS: Abdominal pain right upper and lower quadrant, diffuse in that area, constant.  No blood in stool.  No change in bowel movements.  No vomiting, no constipation.  No weight loss or fever.  PERTINENT  PMH / PSH: I have reviewed the patient's medications, allergies, past medical and surgical history, smoking status and updated in the EMR as appropriate.   OBJECTIVE:  Vital signs reviewed. GENERAL: Well-developed, well-nourished, no acute distress. CARDIOVASCULAR: Regular rate and rhythm no murmur gallop or rub LUNGS: Clear to auscultation bilaterally, no rales or wheeze. ABDOMEN: Soft positive bowel sounds NEURO: No gross focal neurological deficits. MSK: Movement of extremity x 4.    ASSESSMENT / PLAN:

## 2018-01-13 ENCOUNTER — Encounter: Payer: Self-pay | Admitting: Gastroenterology

## 2018-01-13 ENCOUNTER — Other Ambulatory Visit: Payer: Self-pay

## 2018-01-13 ENCOUNTER — Ambulatory Visit (AMBULATORY_SURGERY_CENTER): Payer: Medicaid Other | Admitting: Gastroenterology

## 2018-01-13 VITALS — BP 112/76 | HR 62 | Temp 98.2°F | Resp 11 | Ht 66.0 in | Wt 159.0 lb

## 2018-01-13 DIAGNOSIS — D123 Benign neoplasm of transverse colon: Secondary | ICD-10-CM

## 2018-01-13 DIAGNOSIS — D120 Benign neoplasm of cecum: Secondary | ICD-10-CM | POA: Diagnosis not present

## 2018-01-13 DIAGNOSIS — Z1211 Encounter for screening for malignant neoplasm of colon: Secondary | ICD-10-CM | POA: Diagnosis not present

## 2018-01-13 DIAGNOSIS — R1084 Generalized abdominal pain: Secondary | ICD-10-CM

## 2018-01-13 MED ORDER — SODIUM CHLORIDE 0.9 % IV SOLN: 500.0000 mL | Freq: Once | INTRAVENOUS | Status: DC

## 2018-01-13 NOTE — Progress Notes (Signed)
A and O x3. Report to RN. Tolerated MAC anesthesia well.

## 2018-01-13 NOTE — Op Note (Signed)
[facility] Patient Name: Ellery [id number] Procedure Date: 01/13/2018 8:24 AM MRN: [id number] Endoscopist: Remo Lipps P. Havery Moros , MD Age: 37 Referring MD:  Date of Birth: 04/26/1980 Gender: Male Account #: [id number] Procedure:                Colonoscopy Indications:              Generalized abdominal pain, negative CT imaging Medicines:                Monitored Anesthesia Care Procedure:                Pre-Anesthesia Assessment:                           - Prior to the procedure, a History and Physical                            was performed, and patient medications and                            allergies were reviewed. The patient's tolerance of                            previous anesthesia was also reviewed. The risks                            and benefits of the procedure and the sedation                            options and risks were discussed with the patient.                            All questions were answered, and informed consent                            was obtained. Prior Anticoagulants: The patient has                            taken no previous anticoagulant or antiplatelet                            agents. ASA Grade Assessment: II - A patient with                            mild systemic disease. After reviewing the risks                            and benefits, the patient was deemed in                            satisfactory condition to undergo the procedure.                           After obtaining informed consent, the colonoscope  was passed under direct vision. Throughout the                            procedure, the patient's blood pressure, pulse, and                            oxygen saturations were monitored continuously. The                            Colonoscope was introduced through the anus and                            advanced to the the terminal ileum, with                            identification  of the appendiceal orifice and IC                            valve. The colonoscopy was performed without                            difficulty. The patient tolerated the procedure                            well. The quality of the bowel preparation was                            good. The terminal ileum, ileocecal valve,                            appendiceal orifice, and rectum were photographed. Scope In: 8:31:20 AM Scope Out: 8:49:12 AM Scope Withdrawal Time: 0 hours 15 minutes 29 seconds  Total Procedure Duration: 0 hours 17 minutes 52 seconds  Findings:                 The perianal and digital rectal examinations were                            normal.                           The terminal ileum appeared normal.                           A 4 mm polyp was found in the cecum. The polyp was                            sessile. The polyp was removed with a cold snare.                            Resection and retrieval were complete.                           A 3 mm polyp was found in the transverse colon. The  polyp was sessile. The polyp was removed with a                            cold snare. Resection and retrieval were complete.                           The exam was otherwise without abnormality. Complications:            No immediate complications. Estimated blood loss:                            Minimal. Estimated Blood Loss:     Estimated blood loss was minimal. Impression:               - The examined portion of the ileum was normal.                           - One 4 mm polyp in the cecum, removed with a cold                            snare. Resected and retrieved.                           - One 3 mm polyp in the transverse colon, removed                            with a cold snare. Resected and retrieved.                           - The examination was otherwise normal.                           No cause for abdominal pain on colonoscopy. Pain                             could be musculoskeletal in etiology given negative                            workup to date (component of back pain as well). Recommendation:           - Patient has a contact number available for                            emergencies. The signs and symptoms of potential                            delayed complications were discussed with the                            patient. Return to normal activities tomorrow.                            Written discharge instructions were provided to the  patient.                           - Resume previous diet.                           - Continue present medications.                           - Await pathology results.                           - Will discuss options with patient regarding                            further workup / management Bohdi Leeds P. Adama Ivins, MD 01/13/2018 8:53:50 AM This report has been signed electronically.

## 2018-01-13 NOTE — Progress Notes (Signed)
Pt's states no medical or surgical changes since previsit or office visit. 

## 2018-01-13 NOTE — Progress Notes (Signed)
Called pt's case worker Elodia Florence [phone].  Case worker says they will arrive to provide transportation in half an hour.  Pt. Was asleep when brought into recovery.  Pt.'s wife does not speak [place].   Hence the delay in discharge time.

## 2018-01-13 NOTE — Patient Instructions (Signed)
Impression/Recommendations:  Polyp handout given to patient.  Resume previous diet. Continue present medications.  Await pathology results.  YOU HAD AN ENDOSCOPIC PROCEDURE TODAY AT THE [facility]:   Refer to the procedure report that was given to you for any specific questions about what was found during the examination.  If the procedure report does not answer your questions, please call your gastroenterologist to clarify.  If you requested that your care partner not be given the details of your procedure findings, then the procedure report has been included in a sealed envelope for you to review at your convenience later.  YOU SHOULD EXPECT: Some feelings of bloating in the abdomen. Passage of more gas than usual.  Walking can help get rid of the air that was put into your GI tract during the procedure and reduce the bloating. If you had a lower endoscopy (such as a colonoscopy or flexible sigmoidoscopy) you may notice spotting of blood in your stool or on the toilet paper. If you underwent a bowel prep for your procedure, you may not have a normal bowel movement for a few days.  Please Note:  You might notice some irritation and congestion in your nose or some drainage.  This is from the oxygen used during your procedure.  There is no need for concern and it should clear up in a day or so.  SYMPTOMS TO REPORT IMMEDIATELY:   Following lower endoscopy (colonoscopy or flexible sigmoidoscopy):  Excessive amounts of blood in the stool  Significant tenderness or worsening of abdominal pains  Swelling of the abdomen that is new, acute  Fever of 100F or higher  For urgent or emergent issues, a gastroenterologist can be reached at any hour by calling [phone].   DIET:  We do recommend a small meal at first, but then you may proceed to your regular diet.  Drink plenty of fluids but you should avoid alcoholic beverages for 24 hours.  ACTIVITY:  You should plan to take it  easy for the rest of today and you should NOT DRIVE or use heavy machinery until tomorrow (because of the sedation medicines used during the test).    FOLLOW UP: Our staff will call the number listed on your records the next business day following your procedure to check on you and address any questions or concerns that you may have regarding the information given to you following your procedure. If we do not reach you, we will leave a message.  However, if you are feeling well and you are not experiencing any problems, there is no need to return our call.  We will assume that you have returned to your regular daily activities without incident.  If any biopsies were taken you will be contacted by phone or by letter within the next 1-3 weeks.  Please call us at [phone] if you have not heard about the biopsies in 3 weeks.    SIGNATURES/CONFIDENTIALITY: You and/or your care partner have signed paperwork which will be entered into your electronic medical record.  These signatures attest to the fact that that the information above on your After Visit Summary has been reviewed and is understood.  Full responsibility of the confidentiality of this discharge information lies with you and/or your care-partner. 

## 2018-01-13 NOTE — Progress Notes (Signed)
Called to room to assist during endoscopic procedure.  Patient ID and intended procedure confirmed with present staff. Received instructions for my participation in the procedure from the performing physician.  

## 2018-01-14 ENCOUNTER — Telehealth: Payer: Self-pay

## 2018-01-14 NOTE — Telephone Encounter (Signed)
  Follow up Call-  Call back number 01/13/2018  Post procedure Call Back phone  # [phone]  Permission to leave phone message No     Patient questions:  Do you have a fever, pain , or abdominal swelling? No. Pain Score  0 *  Have you tolerated food without any problems? Yes.    Have you been able to return to your normal activities? Yes.    Do you have any questions about your discharge instructions: Diet   No. Medications  No. Follow up visit  No.  Do you have questions or concerns about your Care? No.  Actions: * If pain score is 4 or above: No action needed, pain <4.

## 2018-02-14 ENCOUNTER — Other Ambulatory Visit: Payer: Self-pay

## 2018-02-14 ENCOUNTER — Encounter: Payer: Self-pay | Admitting: Family Medicine

## 2018-02-14 ENCOUNTER — Ambulatory Visit: Payer: Medicaid Other | Admitting: Family Medicine

## 2018-02-14 VITALS — BP 106/62 | HR 65 | Temp 97.9°F | Ht 66.0 in | Wt 158.8 lb

## 2018-02-14 DIAGNOSIS — Z23 Encounter for immunization: Secondary | ICD-10-CM | POA: Diagnosis not present

## 2018-02-14 DIAGNOSIS — M25512 Pain in left shoulder: Secondary | ICD-10-CM | POA: Diagnosis not present

## 2018-02-14 DIAGNOSIS — R1084 Generalized abdominal pain: Secondary | ICD-10-CM

## 2018-02-14 MED ORDER — DICYCLOMINE HCL 20 MG PO TABS: 20.0000 mg | ORAL_TABLET | Freq: Four times a day (QID) | ORAL | 0 refills | Status: DC

## 2018-02-14 NOTE — Patient Instructions (Signed)
It was a pleasure to see you today! Thank you for choosing [facility] for your primary care. Patrick Reed was seen for belly pain. Come back to the clinic in 1 month to discuss results of your diet, and go to the emergency room if you have emergency symtpoms.   I will call with your results.  Please try to avoid dairy products (milk/cheese/yogurts) for the next month and then come back to talk to me about your progress   Please bring all your medications to every doctors visit   Sign up for My Chart to have easy access to your labs results, and communication with your Primary care physician.     Please check-out at the front desk before leaving the clinic.     Best,  Dr. Sherene Sires FAMILY MEDICINE RESIDENT - PGY2 02/14/2018 9:05 AM

## 2018-02-14 NOTE — Progress Notes (Signed)
    Subjective:  Patrick Reed is a 37 y.o. male who presents to the [facility] today with a chief complaint of chronic belly pain.   HPI: Patient presents to discuss results of recent CT abdomen, he has had no change in his belly pain.  We discussed how all of his work-ups to date have been negative, and that we have no specific findings to discuss.  This is very frustrating to him as it has been a long-term situation.  He is however reassured, that we found no significant problems.  He wants to know what the next solution is.  He is not losing weight, his appetite is good, he has no nausea, or vomiting.  He is able to work when he is not having this episodic belly pain, although sometimes it interferes.  He reports no blood in urine or in stool.  And no skin changes.  He also complains of shoulder pain in his left shoulder, he says this is been present for about a month.  It is in the shoulder joint itself.  No tenderness to surrounding muscles no radiation.  Pain is worse with AB duction.  He denies any specific injuries although he does lift things constantly at work.  He is able to continue working but it is painful.  No distal deficits in hand strength or sensation.  No chest pain component to this.  No skin changes swelling or erythema.   Objective:  Physical Exam: BP 106/62   Pulse 65   Temp 97.9 F (36.6 C) (Oral)   Ht 5\' 6"  (1.676 m)   Wt 158 lb 12.8 oz (72 kg)   SpO2 96%   BMI 25.63 kg/m   Gen: NAD, resting comfortably CV: RRR with no murmurs appreciated Pulm: NWOB, CTAB with no crackles, wheezes, or rhonchi GI: Normal bowel sounds present, no tympany/rebounding. No hepato/splenomegaly noted.  Soft, Nontender, Nondistended.  Mildly obese MSK: no edema, cyanosis, or clubbing noted Skin: warm, dry Neuro: grossly normal, moves all extremities Psych: Normal affect and thought content  No results found for this or any previous visit (from the past 72 hour(s)).   Assessment/Plan:    Acute pain of left shoulder No specific injury warranting imaging at this time, pain is mild but consistently interfering with work.  Patient referred to physical therapy  Generalized abdominal pain Patient again with negative imaging.  Now with [place] states and prior [place] immigration in [place], all negative.  We will pull antigliadin test (which came back negative) and instruct patient to attempt low dairy diet.  He is told that this diet change is not a permanent one this is temporary to gauge his reaction he should come back to see [place] in about 1 month to discuss.  We also discussed with him that we feel it is inappropriate to pursue more imaging at this time.  We will refill Bentyl  Need for immunization against influenza - Flu Vaccine QUAD 36+ mos IM  Sherene Sires, DO FAMILY MEDICINE RESIDENT - PGY2 02/20/2018 10:40 AM

## 2018-02-17 LAB — GLIADIN ANTIBODIES, SERUM
Antigliadin Abs, IgA: 18 units (ref 0–19)
Gliadin IgG: 14 units (ref 0–19)

## 2018-02-18 ENCOUNTER — Encounter: Payer: Self-pay | Admitting: Family Medicine

## 2018-02-20 DIAGNOSIS — M25512 Pain in left shoulder: Secondary | ICD-10-CM | POA: Insufficient documentation

## 2018-02-20 NOTE — Assessment & Plan Note (Addendum)
Patient again with negative imaging.  Now with full work[place] states and prior to immigration in [place], all negative.  We will pull antigliadin test (which came back negative) and instruct patient to attempt low dairy diet.  He is told that this diet change is not a permanent one this is temporary to gauge his reaction he should come back to see [place] in about 1 month to discuss.  We also discussed with him that we feel it is inappropriate to pursue more imaging at this time.  We will refill Bentyl

## 2018-02-20 NOTE — Assessment & Plan Note (Signed)
No specific injury warranting imaging at this time, pain is mild but consistently interfering with work.  Patient referred to physical therapy

## 2018-04-04 ENCOUNTER — Encounter: Payer: Self-pay | Admitting: Family Medicine

## 2018-05-16 ENCOUNTER — Ambulatory Visit: Payer: Medicaid Other | Admitting: Family Medicine

## 2018-06-06 ENCOUNTER — Encounter: Payer: Self-pay | Admitting: Family Medicine

## 2018-06-06 ENCOUNTER — Ambulatory Visit: Payer: Medicaid Other | Admitting: Family Medicine

## 2018-06-06 ENCOUNTER — Other Ambulatory Visit: Payer: Self-pay

## 2018-06-06 VITALS — BP 118/70 | HR 72 | Temp 97.5°F | Ht 66.0 in | Wt 158.2 lb

## 2018-06-06 DIAGNOSIS — R740 Nonspecific elevation of levels of transaminase and lactic acid dehydrogenase [LDH]: Secondary | ICD-10-CM | POA: Diagnosis not present

## 2018-06-06 DIAGNOSIS — R7401 Elevation of levels of liver transaminase levels: Secondary | ICD-10-CM

## 2018-06-06 DIAGNOSIS — M545 Low back pain, unspecified: Secondary | ICD-10-CM

## 2018-06-06 DIAGNOSIS — R109 Unspecified abdominal pain: Secondary | ICD-10-CM | POA: Diagnosis not present

## 2018-06-06 DIAGNOSIS — H6690 Otitis media, unspecified, unspecified ear: Secondary | ICD-10-CM | POA: Diagnosis not present

## 2018-06-06 DIAGNOSIS — H60391 Other infective otitis externa, right ear: Secondary | ICD-10-CM

## 2018-06-06 DIAGNOSIS — G8929 Other chronic pain: Secondary | ICD-10-CM

## 2018-06-06 MED ORDER — OFLOXACIN 0.3 % OT SOLN: 5.0000 [drp] | Freq: Every day | OTIC | 0 refills | Status: DC

## 2018-06-06 MED ORDER — IBUPROFEN 600 MG PO TABS: 600.0000 mg | ORAL_TABLET | Freq: Three times a day (TID) | ORAL | 0 refills | Status: DC | PRN

## 2018-06-06 MED ORDER — AMOXICILLIN 500 MG PO CAPS: 500.0000 mg | ORAL_CAPSULE | Freq: Three times a day (TID) | ORAL | 0 refills | Status: DC

## 2018-06-08 DIAGNOSIS — R740 Nonspecific elevation of levels of transaminase and lactic acid dehydrogenase [LDH]: Principal | ICD-10-CM

## 2018-06-08 DIAGNOSIS — H60391 Other infective otitis externa, right ear: Secondary | ICD-10-CM | POA: Insufficient documentation

## 2018-06-08 DIAGNOSIS — M545 Low back pain: Secondary | ICD-10-CM

## 2018-06-08 DIAGNOSIS — G8929 Other chronic pain: Secondary | ICD-10-CM | POA: Insufficient documentation

## 2018-06-08 DIAGNOSIS — R7401 Elevation of levels of liver transaminase levels: Secondary | ICD-10-CM | POA: Insufficient documentation

## 2018-06-08 DIAGNOSIS — R109 Unspecified abdominal pain: Secondary | ICD-10-CM

## 2018-06-08 DIAGNOSIS — H6690 Otitis media, unspecified, unspecified ear: Secondary | ICD-10-CM | POA: Insufficient documentation

## 2018-06-08 NOTE — Assessment & Plan Note (Signed)
Patient with chronic abdominal pain, has received significant work-up while in [place] and some [place].  Last abdominal imaging showed potential steatosis of liver but nothing else significant.  Prior GI note had said that if pain continued after imaging that he should follow-up with them but due to miscommunication he did not do so.  Will replace referral and have him go see GI again as this Pain is consistent and unchanging in nature

## 2018-06-08 NOTE — Assessment & Plan Note (Signed)
Patient with history of elevated transaminase level minimally above normal.  Has not had labs for approximately 5 months at this time but his pain is persistent, as he is going back to see GI we will redraw CMP.  Patient left before draw could be done, CMA's will notify him that in order has been placed in come by for lab visit to get this drawn if you would like it done before he sees the GI doctor.

## 2018-06-08 NOTE — Assessment & Plan Note (Signed)
Patient with chronic right-sided back pain.  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  No radiation no sciatic symptoms no incontinence no midline tenderness  On the chance that his MSK seeing his it seems to be on physical exam patient consents to referral to physical therapy.

## 2018-06-08 NOTE — Assessment & Plan Note (Signed)
No hearing changes with pain in his ear for approximately 3 days on right side.  Will give drops, exam shows erythema in canal and fluctuance behind TM

## 2018-06-08 NOTE — Progress Notes (Signed)
Subjective:  Patrick Reed is a 38 y.o. male who presents to the [facility] today with a chief complaint of abdominal pain.   HPI: Chronic abdominal pain Patient with chronic abdominal pain, has received significant work-up while in [place] and some [place].  Last abdominal imaging showed potential steatosis of liver but nothing else significant.  Prior GI note had said that if pain continued after imaging that he should follow-up with them but due to miscommunication he did not do so.  Pain is consistent and unchanging in nature.  No constipation concerns or dysuria symptoms  Acute otitis media No hearing changes with pain in his ear for approximately 3 days on right side.  Will give drops, exam shows erythema in canal and fluctuance behind TM  Infective otitis externa of right ear No hearing changes with pain in his ear for approximately 3 days on right side.  No noticed bleeding or discharge from ear.  Chronic right-sided low back pain without sciatica Patient with chronic right-sided back pain.  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  No radiation no sciatic symptoms no incontinence no midline tenderness  Objective:  Physical Exam: BP 118/70   Pulse 72   Temp (!) 97.5 F (36.4 C) (Oral)   Ht 5\' 6"  (1.676 m)   Wt 158 lb 3.2 oz (71.8 kg)   SpO2 97%   BMI 25.53 kg/m   Gen: NAD, resting comfortably ENT: Right ear initially with impacted cerumen, cleared by CMA.  Irritation to canal and fluctuance behind TM.  Left ear normal CV: RRR with no murmurs appreciated Pulm: NWOB, CTAB with no crackles, wheezes, or rhonchi GI: Normal bowel sounds present.  Nondistended, minimal tenderness to right flank along axillary below rib cage.  MSK: no edema, cyanosis, or clubbing noted.  Right-sided  lumbar tenderness with no radiculopathy down right leg.  No muscle wasting.  No  pain to midline Skin: warm, dry Neuro: grossly normal, moves all extremities Psych: Normal affect and thought content  No results found for this or any previous visit (from the past 72 hour(s)).   Assessment/Plan:  Acute otitis media No hearing changes with pain in his ear for approximately 3 days on right side.  Will give drops, exam shows erythema in canal and fluctuance behind TM  Infective otitis externa of right ear No hearing changes with pain in his ear for approximately 3 days on right side.  Will give drops, exam shows erythema in canal and fluctuance behind TM  Chronic abdominal pain Patient with chronic abdominal pain, has received significant work-up while in [place] and [place].  Last abdominal imaging showed potential steatosis of liver but nothing else significant.  Prior GI note had said that if pain continued after imaging that he should follow-up with them but due to miscommunication he did not do so.  Will replace referral and have him go see GI again as this Pain is consistent and unchanging in nature  Chronic right-sided low back pain without sciatica Patient with chronic right-sided back pain.  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  No radiation no sciatic symptoms no incontinence no midline tenderness  On the chance that his MSK seeing his it seems to be on physical exam patient consents to referral to physical therapy.  Elevated transaminase level Patient with history of elevated transaminase level minimally above normal.  Has not had labs for approximately 5 months at this time but his pain is persistent, as he is going back to see GI we will redraw CMP.  Patient left before draw  could be done, CMA's will notify him that in order has been placed in come by for lab visit to get this drawn if you would like it done before he sees the GI doctor.   Sherene Sires, DO FAMILY MEDICINE RESIDENT - PGY2 06/08/2018 3:48 PM

## 2018-06-10 ENCOUNTER — Other Ambulatory Visit: Payer: Self-pay

## 2018-06-10 ENCOUNTER — Other Ambulatory Visit (INDEPENDENT_AMBULATORY_CARE_PROVIDER_SITE_OTHER): Payer: Medicaid Other

## 2018-06-10 ENCOUNTER — Ambulatory Visit: Payer: Medicaid Other | Admitting: Physician Assistant

## 2018-06-10 ENCOUNTER — Encounter: Payer: Self-pay | Admitting: Physician Assistant

## 2018-06-10 VITALS — BP 122/78 | HR 68 | Temp 97.5°F | Ht 66.0 in | Wt 161.0 lb

## 2018-06-10 DIAGNOSIS — R1011 Right upper quadrant pain: Secondary | ICD-10-CM

## 2018-06-10 DIAGNOSIS — K219 Gastro-esophageal reflux disease without esophagitis: Secondary | ICD-10-CM | POA: Diagnosis not present

## 2018-06-10 DIAGNOSIS — R1031 Right lower quadrant pain: Secondary | ICD-10-CM | POA: Diagnosis not present

## 2018-06-10 LAB — CBC
HEMATOCRIT: 46.8 % (ref 39.0–52.0)
Hemoglobin: 16.1 g/dL (ref 13.0–17.0)
MCHC: 34.4 g/dL (ref 30.0–36.0)
MCV: 86.6 fl (ref 78.0–100.0)
Platelets: 313 10*3/uL (ref 150.0–400.0)
RBC: 5.4 Mil/uL (ref 4.22–5.81)
RDW: 12.9 % (ref 11.5–15.5)
WBC: 9.2 10*3/uL (ref 4.0–10.5)

## 2018-06-10 LAB — COMPREHENSIVE METABOLIC PANEL
ALT: 23 U/L (ref 0–53)
AST: 19 U/L (ref 0–37)
Albumin: 5 g/dL (ref 3.5–5.2)
Alkaline Phosphatase: 72 U/L (ref 39–117)
BUN: 11 mg/dL (ref 6–23)
CHLORIDE: 101 meq/L (ref 96–112)
CO2: 30 mEq/L (ref 19–32)
Calcium: 9.8 mg/dL (ref 8.4–10.5)
Creatinine, Ser: 0.82 mg/dL (ref 0.40–1.50)
GFR: 126.42 mL/min (ref >60.00)
Glucose, Bld: 107 mg/dL — ABNORMAL HIGH (ref 70–99)
Potassium: 4 mEq/L (ref 3.5–5.1)
Sodium: 138 mEq/L (ref 135–145)
Total Bilirubin: 0.3 mg/dL (ref 0.2–1.2)
Total Protein: 8.1 g/dL (ref 6.0–8.3)

## 2018-06-10 LAB — LIPASE: Lipase: 47 U/L (ref 11.0–59.0)

## 2018-06-10 MED ORDER — OMEPRAZOLE 40 MG PO CPDR: 40.0000 mg | DELAYED_RELEASE_CAPSULE | Freq: Every day | ORAL | 3 refills | Status: DC

## 2018-06-10 NOTE — Patient Instructions (Signed)
If you are age 40 or older, your body mass index should be between 23-30. Your Body mass index is 25.99 kg/m. If this is out of the aforementioned range listed, please consider follow up with your Primary Care Provider.  If you are age 67 or younger, your body mass index should be between 19-25. Your Body mass index is 25.99 kg/m. If this is out of the aformentioned range listed, please consider follow up with your Primary Care Provider.   Your provider has requested that you go to the basement level for lab work before leaving today. Press "B" on the elevator. The lab is located at the first door on the left as you exit the elevator.  We have sent the following medications to your pharmacy for you to pick up at your convenience: Omeprazole  You have been scheduled for an abdominal ultrasound at [facility] (1st floor of hospital) on 06/17/18 at 9 am. Please arrive 15 minutes prior to your appointment for registration. Make certain not to have anything to eat or drink after midnight the day prior to your appointment. Should you need to reschedule your appointment, please contact radiology at [phone]. This test typically takes about 30 minutes to perform.  We will call you with results.  Thank you for choosing me and [facility].    Ellouise Newer, PA-C   To help prevent the possible spread of infection to our patients, communities, and staff; we will be implementing the following measures:  Please only allow one visitor/family member to accompany you to any upcoming appointments with [facility]. If you have any concerns about this please contact our office to discuss prior to the appointment.

## 2018-06-10 NOTE — Progress Notes (Signed)
Agree with assessment as outlined. Repeat labs normal. Symptoms not typical for biliary colic but can check [place]. I suspect he has musculoskeletal / neuropathic pain, unclear if related to back pain. Pending [place] negative would give him a trial of gabapentin or a TCA next to see how he responds, and consideration for referral to pain management.

## 2018-06-10 NOTE — Progress Notes (Signed)
Chief Complaint: Right lower quadrant pain  HPI:    Patrick Reed is a 38 year old African-American male, known to Dr. Havery Moros, with a past medical history as listed below, who was referred to me by Sherene Sires, DO for a complaint of abdominal pain.     11/22/2017 office visit with me to discuss abdominal pain.  At that point he had been seen at various establishments for his chronic abdominal pain but "no one had really worked it up".  Described abdominal pain for 10 years a burning in sensation 9-10/10 in his right lower quadrant and may radiate up to his right upper quadrant.  Did sometimes radiate around to his back.  Sitting for long periods of time or standing made it worse.  Previous work-up including 2 CT scans in [place] and EGD about 5 years ago.      12/09/2017 CT abdomen pelvis with contrast with slight hypoattenuation of the liver likely reflecting hepatic steatosis and otherwise negative.    Colonoscopy 01/13/2018 Dr. Havery Moros with a 4 mm polyp in the cecum, one 3 mm polyp in the transverse colon and otherwise normal exam.  There was no cause for abdominal pain on colonoscopy.  It was thought the pain could be musculoskeletal in etiology given negative work-up to date plus/minus a component of back pain as well.     Today, the patient describes that he continues with a right lower quadrant pain which radiates through and around to his back, this seems slightly worse than previous.  Tells me this can occur all day long and is rated as 8-9/10, now it does not seem to matter if he is moving around or not.  Does tell me occasionally makes him feel like his leg is numb/paralyzed.    Also complains of a right upper quadrant pain which is "way worse than it has ever been ", at least a 10/10.  Does describe having to put a pillow on his right side and providing some pressure in order to sleep.  This does not seem specifically worse when he eats but he also complains of some epigastric pain at  times after eating.  Does not recall being on Omeprazole.    Denies fever, chills, weight loss, nausea, vomiting, heartburn, reflux or symptoms that awaken him from sleep.      No past medical history  Current Outpatient Medications  Medication Sig Dispense Refill  . amoxicillin (AMOXIL) 500 MG capsule Take 1 capsule (500 mg total) by mouth 3 (three) times daily for 7 days. 21 capsule 0  . dicyclomine (BENTYL) 20 MG tablet Take 1 tablet (20 mg total) by mouth 4 (four) times daily. Take 20-30 minutes before meals and bedtime 120 tablet 0  . ibuprofen (ADVIL,MOTRIN) 600 MG tablet Take 1 tablet (600 mg total) by mouth every 8 (eight) hours as needed. 30 tablet 0  . Na Sulfate-K Sulfate-Mg Sulf 17.5-3.13-1.6 GM/177ML SOLN Suprep-Use as directed 354 mL 0  . ofloxacin (FLOXIN) 0.3 % OTIC solution Place 5 drops into the right ear daily. 5 mL 0   No current facility-administered medications for this visit.     Allergies as of 06/10/2018  . (No Known Allergies)    Family History  Family history unknown: Yes    Social History   Socioeconomic History  . Marital status: Married    Spouse name: Not on file  . Number of children: Not on file  . Years of education: Not on file  . Highest education level:  Not on file  Occupational History  . Occupation: unemployed  Social Needs  . Financial resource strain: Not on file  . Food insecurity:    Worry: Not on file    Inability: Not on file  . Transportation needs:    Medical: Not on file    Non-medical: Not on file  Tobacco Use  . Smoking status: Never Smoker  . Smokeless tobacco: Never Used  Substance and Sexual Activity  . Alcohol use: Never    Frequency: Never  . Drug use: Never  . Sexual activity: Never  Lifestyle  . Physical activity:    Days per week: Not on file    Minutes per session: Not on file  . Stress: Not on file  Relationships  . Social connections:    Talks on phone: Not on file    Gets together: Not on file     Attends religious service: Not on file    Active member of club or organization: Not on file    Attends meetings of clubs or organizations: Not on file    Relationship status: Not on file  . Intimate partner violence:    Fear of current or ex partner: Not on file    Emotionally abused: Not on file    Physically abused: Not on file    Forced sexual activity: Not on file  Other Topics Concern  . Not on file  Social History Narrative   [place] was a [employer] copy. Has lived 2 months in [place].     Review of Systems:    Constitutional: No weight loss, fever or chills Cardiovascular: No chest pain  Respiratory: No SOB Gastrointestinal: See HPI and otherwise negative   Physical Exam:  Vital signs: BP 122/78   Pulse 68   Temp (!) 97.5 F (36.4 C)   Ht 5\' 6"  (1.676 m)   Wt 161 lb (73 kg)   SpO2 99%   BMI 25.99 kg/m   Constitutional:   Pleasant AA male appears to be in NAD, Well developed, Well nourished, alert and cooperative Respiratory: Respirations even and unlabored. Lungs clear to auscultation bilaterally.   No wheezes, crackles, or rhonchi.  Cardiovascular: Normal S1, S2. No MRG. Regular rate and rhythm. No peripheral edema, cyanosis or pallor.  Gastrointestinal:  Soft, nondistended, Moderate RUQ ttp. No rebound or guarding. Normal bowel sounds. No appreciable masses or hepatomegaly. Rectal:  Not performed.  Msk:  Right hip ttp, Symmetrical without gross deformities. Without edema, no deformity or joint abnormality.  Psychiatric: Demonstrates good judgement and reason without abnormal affect or behaviors.  No recent labs or imaging.  Assessment: 1.  Right upper quadrant pain: Worse/new for the patient; consider gallbladder etiology versus gastritis versus other 2.  Right lower quadrant pain: Chronic for the patient over the past 10+ years, previous work-up including 3 CTs, EGD and colonoscopy all of which were unrevealing for cause, today patient  tells me that sometimes this makes his leg go numb; likely musculoskeletal 3.  GERD: described as epigastric pain after eating at times; consider likely gastritis/GERD  Plan: 1.  Prescribed Omeprazole 40 mg daily, 30-60 minutes before eating breakfast #30 with 3 refills 2.  Ordered CBC, CMP and lipase 3.  Ordered right upper quadrant ultrasound. 4.  If above work-up is negative would recommend patient see sports medicine/orthopedic physician in regards to likely musculoskeletal pain 5.  Discussed above with patient.  He verbalized understanding. 6.  Patient to follow in clinic with  Dr. Havery Moros or myself as needed/recommended in the future.  Ellouise Newer, PA-C [facility] 06/10/2018, 8:26 AM  Cc: Sherene Sires, DO

## 2018-06-12 ENCOUNTER — Telehealth: Payer: Self-pay | Admitting: Physician Assistant

## 2018-06-12 NOTE — Telephone Encounter (Signed)
Anderson Malta please review for urgency of [place] can it be moved to 4-6 weeks out?

## 2018-06-12 NOTE — Telephone Encounter (Signed)
Micah called from [facility] ultrasound wanting to speak with the nurse about getting the pt ultrasound resched. If it was not an urgent request.

## 2018-06-13 NOTE — Telephone Encounter (Signed)
[place] notified that procedure can be moved 4-6 weeks out

## 2018-06-13 NOTE — Telephone Encounter (Signed)
Yes it can be moved. Thanks-JLL

## 2018-06-16 ENCOUNTER — Telehealth: Payer: Self-pay

## 2018-06-16 MED ORDER — NEOMYCIN-POLYMYXIN-HC 3.5-10000-1 OT SOLN: 3.0000 [drp] | Freq: Four times a day (QID) | OTIC | 0 refills | Status: AC

## 2018-06-16 NOTE — Telephone Encounter (Signed)
LVM to call office back to inform him of below.Patrick Reed, CMA

## 2018-06-16 NOTE — Telephone Encounter (Signed)
Otic drops prescribed prior were not covered by Medicaid.  Patient called in requesting change in medication  Check Medicaid preferred list and sent in generic Cortisporin  Dr. Criss Rosales

## 2018-06-16 NOTE — Telephone Encounter (Signed)
Pt called nurse line stating the ear drops that were prescribed to him are not covered by medicaid. I could not find the preferred medication on the web site. Please advise.

## 2018-06-17 ENCOUNTER — Ambulatory Visit (HOSPITAL_COMMUNITY): Payer: Medicaid Other

## 2018-06-18 ENCOUNTER — Ambulatory Visit: Payer: Medicaid Other | Admitting: Physical Therapy

## 2018-06-18 NOTE — Telephone Encounter (Signed)
Pt informed of below and ended up transferring to [facility] on [address]. April Zimmerman Rumple, CMA

## 2018-07-17 ENCOUNTER — Ambulatory Visit (HOSPITAL_COMMUNITY): Payer: Medicaid Other

## 2018-08-20 ENCOUNTER — Encounter: Payer: Self-pay | Admitting: Physical Therapy

## 2018-08-20 ENCOUNTER — Other Ambulatory Visit: Payer: Self-pay

## 2018-08-20 ENCOUNTER — Ambulatory Visit: Payer: Medicaid Other | Attending: Family Medicine | Admitting: Physical Therapy

## 2018-08-20 DIAGNOSIS — G8929 Other chronic pain: Secondary | ICD-10-CM

## 2018-08-20 DIAGNOSIS — M5440 Lumbago with sciatica, unspecified side: Secondary | ICD-10-CM | POA: Diagnosis not present

## 2018-08-20 DIAGNOSIS — M5441 Lumbago with sciatica, right side: Secondary | ICD-10-CM

## 2018-08-20 DIAGNOSIS — M25651 Stiffness of right hip, not elsewhere classified: Secondary | ICD-10-CM | POA: Diagnosis not present

## 2018-08-20 DIAGNOSIS — R29898 Other symptoms and signs involving the musculoskeletal system: Secondary | ICD-10-CM | POA: Diagnosis not present

## 2018-08-21 NOTE — Therapy (Signed)
[place], [place], [address] Phone: [phone]   Fax:  [phone]  Physical Therapy Evaluation  Patient Details  Name: Patrick Reed MRN: [id number] Date of Birth: [birth year] Referring Provider (PT): Lavone Nian Clever, [place]    Encounter Date: 08/20/2018  PT End of Session - 08/21/18 9735    Visit Number  1    Number of Visits  4    Date for PT Re-Evaluation  09/17/18    PT Start Time  1130    PT Stop Time  1214    PT Time Calculation (min)  44 min    Activity Tolerance  Patient tolerated treatment well    Behavior During Therapy  [facility] for tasks assessed/performed       History reviewed. No pertinent past medical history.  History reviewed. No pertinent surgical history.  There were no vitals filed for this visit.   Subjective Assessment - 08/20/18 1142    Subjective  Patient has had pain in [place] for 10 yrs.  He has had several referrals and was finally sent to GI.  they ordered US for next week.  Pain is paired with Rt sided low back pain, Sensory changes in Rt LE.  Sometimes feels the R leg is "paralyzed".  Pain is always simultaneous (back and LLQ).  Moved here from [facility] in June 2019.  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     Pertinent History  chronic pain     Limitations  Sitting;Lifting;Standing;Walking;House hold activities;Other (comment)   works through pain, goes slowly    How long can you sit comfortably?  20 min     How long can you stand comfortably?  repositions all the time     How long can you walk comfortably?  walking increases pain     Diagnostic tests  [place] upcoming 08/26/18. CT abdomen, coloscopy    Patient Stated Goals  pain relief, sleep better     Currently in Pain?  Yes    Pain Score  6     Pain Location  Back    Pain Orientation  Right;Anterior;Posterior    Pain Type  Chronic pain    Pain Radiating Towards  front to back on  Rt side and Rt LE     Pain Onset  More than a month ago    Pain Frequency  Intermittent    Aggravating Factors   prolonged sit, stand, walk, lift     Pain Relieving Factors  lying down pillow on his right side and providing some pressure in order to sleep    Effect of Pain on Daily Activities  pain is more constant        Objective measurements completed on examination: See above findings.              PT Education - 08/21/18 3299    Education Details  PT/POC, HEP, anatomy, pain referral patterns     Person(s) Educated  Patient    Methods  Explanation;Handout;Verbal cues;Tactile cues    Comprehension  Verbalized understanding;Returned demonstration          PT Long Term Goals - 08/21/18 2426      PT LONG TERM GOAL #1   Title  Pt will be able to show Independence with HEP     Baseline  given on eval     Time  4    Period  Weeks  Status  New    Target Date  09/17/18      PT LONG TERM GOAL #2   Title  Pt will report less Rt LE symptoms, improved by 50% with normal daily activities    Baseline  pain is more constant and frequently refers to Rt LE, sensory disturbance     Time  4    Period  Weeks    Status  New    Target Date  09/17/18      PT LONG TERM GOAL #3   Title  Patient will show proper lifting techniques to prevent reinjury with work duties.    Baseline  needs cues for proper form     Time  4    Period  Weeks    Status  New    Target Date  09/17/18             Plan - 08/21/18 0825    Clinical Impression Statement  Patient presents with low back pain in addition to Rt LQ pain which typically occur at the same time.  His work up so far with other imaging and MDs relate the issue to musculoskeletal and I see this coud be possible if the sources of the pathology is iliopsoas muscle and/or upper lumbar spine issues.  He was given HEP and asked to schedule 1 x 3 weeks.  Will follow chart to see outcome of upcoming [place].  His posture is good but has  some tightness in hips, hyperactivity of erector spinae and hypomobile through all lumbar segments.  Post session pain was reduced.      Personal Factors and Comorbidities  Time since onset of injury/illness/exacerbation;Past/Current Experience;Profession    Examination-Activity Limitations  Sit;Bend;Lift;Squat;Stand;Carry    Examination-Participation Restrictions  Other   kids, recreation and work    Stability/Clinical Decision Making  Evolving/Moderate complexity    Clinical Decision Making  Moderate    Rehab Potential  Good    PT Frequency  1x / week    PT Duration  3 weeks   may extend if improving    PT Treatment/Interventions  ADLs/Self Care Home Management;Cryotherapy;Traction;Moist Heat;Therapeutic exercise;Patient/family education;Neuromuscular re-education;Therapeutic activities;Electrical Stimulation;Manual techniques;Passive range of motion    PT Next Visit Plan  manual , hip flexibiility and teach lifting , neutral    PT Home Exercise Plan  anterior hip x 2 and childs pose for release of Lumbar     Consulted and Agree with Plan of Care  Patient       Patient will benefit from skilled therapeutic intervention in order to improve the following deficits and impairments:  Decreased mobility, Hypomobility, Impaired sensation, Improper body mechanics, Decreased range of motion, Increased fascial restricitons, Impaired flexibility, Pain  Visit Diagnosis: Chronic bilateral low back pain with sciatica, sciatica laterality unspecified  Stiffness of right hip, not elsewhere classified  Other symptoms and signs involving the musculoskeletal system     Problem List Patient Active Problem List   Diagnosis Date Noted  . Infective otitis externa of right ear 06/08/2018  . Acute otitis media 06/08/2018  . Chronic abdominal pain 06/08/2018  . Chronic right-sided low back pain without sciatica 06/08/2018  . Elevated transaminase level 06/08/2018  . Acute pain of left shoulder  02/20/2018  . Refugee health examination 11/05/2017  . Immigrant with language difficulty 11/05/2017  . Generalized abdominal pain 11/05/2017  . Cerumen debris on tympanic membrane of both ears 11/05/2017    Patrick Reed 08/21/2018, 9:46 AM  [facility]  [place], [place], [address] Phone: [phone]   Fax:  [phone]  Name: Patrick Reed MRN: [id number] Date of Birth: 16-Jul-1980   Raeford Razor, PT 08/21/18 9:47 AM Phone: [phone] Fax: [phone]

## 2018-08-21 NOTE — Patient Instructions (Signed)
Step 1  Step 2  Hip Flexor Stretch at Edge of Bed reps: 10  sets: 2  hold: 30  daily: 1  weekly: 7 Setup  Begin lying diagonally at the edge of table or bed, with the leg furthest from the edge bent. Movement  Leg your straight leg hang off the edge of the bed, then pull your bent leg toward your chest and hold. You should feel a stretch in front of the hip of your hanging leg. Tip  Make sure to keep your upper body relaxed and do not to let your low back arch during the stretch. Step 1  Step 2  Step 3  Thomas Stretch on Table reps: 10  sets: 2  hold: 30  daily: 1  weekly: 7 Setup  Sit at the edge of a bed or table with both legs hanging off the edge.  Movement  Lift one leg toward your chest, and lean backward onto the table at the same time. You should feel a stretch in the front of the hip of your leg that is hanging toward the floor.  Tip  Make sure not to let your low back arch during the stretch. Step 1  Step 2  Standing 'L' Stretch at [employer] reps: 10  sets: 2  hold: 30  daily: 1  weekly: 7 Setup  Begin in a standing upright position with your hands resting on a counter. Movement  Step your feet backward, allowing enough space for you to hinge forward at your hips and reach your arms overhead until you feel a stretch in your shoulders and your back. You may also feel the stretch in the back of your legs. Hold this position. Tip  Make sure to keep your movements slow and controlled and only move in a pain free range of motion. Step 1  Step 2  Step 3  Childs Pose  reps: 10  sets: 2  hold: 30  daily: 1  weekly: 7 Setup  Begin by placing a folded blanket on the floor and come to sit on your shins. Your feet should be off the end of the blanket. Have your knees as wide as the yoga mat, toes together. Have a 2nd blanket ready. Movement  Take the second folded blanket and place it between your calves and thighs. Make sure it comes well into the back of your knees.  Sit your hips back towards your heels. Rest your forehead on the floor. Keep your arms reaching forward. Return to sitting upright. Tip  You can either continue activating your arms, or relax the elbows to the floor. The blanket under the shins is great for tighter ankles as it reduces the amount of bending at the ankle. The blanket behind the knees can be useful for tight, stiff or painful knees. Fold or roll the blanket for the desired about of support. If the head cannot reach the floor, consider placing a block under the forehead.   DO 2 times a day, 3-5 reps, 30 sec each   Call [phone] with questions

## 2018-08-26 ENCOUNTER — Ambulatory Visit (HOSPITAL_COMMUNITY)
Admission: RE | Admit: 2018-08-26 | Discharge: 2018-08-26 | Disposition: A | Discharge status: Home / Self Care | Payer: Medicaid Other | Source: Ambulatory Visit | Attending: Physician Assistant | Admitting: Physician Assistant

## 2018-08-26 ENCOUNTER — Other Ambulatory Visit: Payer: Self-pay

## 2018-08-26 DIAGNOSIS — R1031 Right lower quadrant pain: Secondary | ICD-10-CM | POA: Diagnosis not present

## 2018-08-26 DIAGNOSIS — R1011 Right upper quadrant pain: Secondary | ICD-10-CM | POA: Insufficient documentation

## 2018-08-26 DIAGNOSIS — K7689 Other specified diseases of liver: Secondary | ICD-10-CM | POA: Diagnosis not present

## 2018-11-24 ENCOUNTER — Telehealth: Payer: Self-pay

## 2018-11-24 ENCOUNTER — Other Ambulatory Visit: Payer: Self-pay

## 2018-11-24 NOTE — Telephone Encounter (Signed)
Left 2 messages today at [employer]. To please call back reference trying to make a referral to them for this patient

## 2018-11-25 ENCOUNTER — Other Ambulatory Visit: Payer: Self-pay

## 2018-11-25 DIAGNOSIS — M546 Pain in thoracic spine: Secondary | ICD-10-CM

## 2018-11-25 DIAGNOSIS — G8929 Other chronic pain: Secondary | ICD-10-CM

## 2018-12-04 ENCOUNTER — Other Ambulatory Visit: Payer: Self-pay

## 2018-12-04 ENCOUNTER — Ambulatory Visit (INDEPENDENT_AMBULATORY_CARE_PROVIDER_SITE_OTHER): Payer: Medicaid Other

## 2018-12-04 DIAGNOSIS — Z23 Encounter for immunization: Secondary | ICD-10-CM | POA: Diagnosis not present

## 2019-01-30 ENCOUNTER — Other Ambulatory Visit: Payer: Self-pay

## 2019-01-30 ENCOUNTER — Ambulatory Visit (INDEPENDENT_AMBULATORY_CARE_PROVIDER_SITE_OTHER): Payer: Medicaid Other | Admitting: Family Medicine

## 2019-01-30 VITALS — BP 114/60 | HR 75

## 2019-01-30 DIAGNOSIS — M545 Low back pain, unspecified: Secondary | ICD-10-CM

## 2019-01-30 DIAGNOSIS — M25559 Pain in unspecified hip: Secondary | ICD-10-CM | POA: Diagnosis not present

## 2019-01-30 DIAGNOSIS — G8929 Other chronic pain: Secondary | ICD-10-CM | POA: Diagnosis not present

## 2019-01-30 DIAGNOSIS — R109 Unspecified abdominal pain: Secondary | ICD-10-CM

## 2019-01-30 MED ORDER — MELOXICAM 7.5 MG PO TABS: 7.5000 mg | ORAL_TABLET | Freq: Every day | ORAL | 0 refills | Status: DC

## 2019-01-30 NOTE — Patient Instructions (Addendum)
It was wonderful to see you today.  I placed a referral to the sports medicine clinic and you should hear from them within the next few weeks.  In the meantime, we will try meloxicam which is an anti-inflammatory medicine, resting this area, ice/heat for 20 minutes at a time as needed throughout the day.  If you experience any numbness in your groin area, sudden weakness of your legs, or any incontinence of your bowel or bladder--you need to be seen immediately in the ED.

## 2019-01-30 NOTE — Progress Notes (Signed)
Subjective:    Patient ID: Patrick Reed, male    DOB: [birth year], 38 y.o.   MRN: [id number]   CC: Abdominal and back pain  HPI: [name] is a 38 year old gentleman with known chronic abdominal and right-sided low back pain presenting discuss the following:  Abdominal/back/hip pain: Chronic, present for several years without any worsening in frequency or severity recently.  States he has a burning sensation that will start in his right upper quadrant that will sometimes extend down to his right lower quadrant and through to his right back.  Pain is constant, however notice it more if he sits a certain way or does not sleep on that side.  He notes improvement when pressure is placed on that side.  When the pain is worse, says sometimes it will go into his hip and down his leg.  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  Does not seem to notice change in the pain after eating.  Denies any associated fever, numbness, weakness. However, said that one time his right leg felt paralyzed and after a little bit it went back to normal.  When the pain gets bad, it seems to make him feel like he has to cough a lot.  He has tried Tylenol with little relief.  Due to these chronic pains, he was sent to gastroenterology back in 05/2018, for which they had extensive work-up in the past including 3 CT abdomen/pelvis, EGD, colonoscopy, and a recent right upper quadrant ultrasound showing hepatic steatosis only.  They felt his pain was not related to GI and referred him to sports medicine for further evaluation.  However he was referred out of network, thus was declined.  Says he did physical therapy back in May and still does some of the exercises they gave him, still has not seen any benefit from this.   Smoking status reviewed  Review of Systems Per HPI    Objective:  BP 114/60   Pulse 75   SpO2 99%  Vitals and  nursing note reviewed  General: NAD, pleasant Cardiac: RRR, normal heart sounds, no murmurs Respiratory: CTAB, normal effort  Abdomen: Soft, tender to palpation of right upper quadrant, epigastric, and right lower quadrant, nondistended, no hepatosplenomegaly palpated, negative Murphy sign. Extremities: no edema or cyanosis.  MSK: No abnormalities noted in gait.  No deformities seen in hips or knees.  Tender to palpation all around right flank without any tenderness on spinous process or paraspinally from thoracic to lumbar spine.  Mild vague tenderness around right hip.  No tenderness to piriformis bilaterally.  Preserved ROM in hip and knee joints.  5/5 lower extremity strength bilaterally.  2/4 patellar reflexes bilaterally.  FABER test elicits some tenderness on the outer portion of his thigh. Sensation to light touch intact. Skin: warm and dry, no rashes noted Neuro: alert and oriented, no focal deficits Psych: normal affect  Assessment & Plan:   Chronic abdominal pain Atypical RUQ/RLQ burning abdominal pain will radiate into his back, hip, and down his right leg present for the past several years. Extensive work-up through GI in the past that has been unremarkable for gastroenterologic etiology. They had referred him to sports medicine feeling that it may be MSK in nature, however this was declined due to out of insurance network. Question chronic and continued muscle strain on the right vs tendinopathy with ?referred pains to his abdomen.  Physical  exam largely unremarkable with the exception of TTP to RUQ/LUQ and vaguely tender to right hip. Trialed PT in 07/2018 with minimal improvement.  Given chronicity and vague/atypical symptomatology, will send to sports medicine for further in-depth evaluation.  In the meantime, encourage continued exercises, anti-inflammatories (sent in meloxicam), and ice/heat as needed.  Educated on proper lifting technique.   Follow-up if symptoms not improving or  sooner if worsening.   [place] Medicine Resident PGY-2

## 2019-01-30 NOTE — Assessment & Plan Note (Addendum)
Atypical RUQ/RLQ burning abdominal pain will radiate into his back, hip, and down his right leg present for the past several years. Extensive work-up through GI in the past that has been unremarkable for gastroenterologic etiology. They had referred him to sports medicine feeling that it may be MSK in nature, however this was declined due to out of insurance network. Question chronic and continued muscle strain on the right vs tendinopathy with ?referred pains to his abdomen.  Physical exam largely unremarkable with the exception of TTP to RUQ/LUQ and vaguely tender to right hip. Trialed PT in 07/2018 with minimal improvement.  Given chronicity and vague/atypical symptomatology, will send to sports medicine for further in-depth evaluation.  In the meantime, encourage continued exercises, anti-inflammatories (sent in meloxicam), and ice/heat as needed.  Educated on proper lifting technique.

## 2019-04-08 ENCOUNTER — Ambulatory Visit (INDEPENDENT_AMBULATORY_CARE_PROVIDER_SITE_OTHER): Payer: Medicaid Other | Admitting: Family Medicine

## 2019-04-08 ENCOUNTER — Encounter: Payer: Self-pay | Admitting: Family Medicine

## 2019-04-08 ENCOUNTER — Other Ambulatory Visit: Payer: Self-pay

## 2019-04-08 VITALS — BP 116/74 | HR 75

## 2019-04-08 DIAGNOSIS — R109 Unspecified abdominal pain: Secondary | ICD-10-CM | POA: Diagnosis not present

## 2019-04-08 DIAGNOSIS — G8929 Other chronic pain: Secondary | ICD-10-CM

## 2019-04-08 DIAGNOSIS — Z0289 Encounter for other administrative examinations: Secondary | ICD-10-CM

## 2019-04-08 MED ORDER — POLYETHYLENE GLYCOL 3350 17 GM/SCOOP PO POWD: 17.0000 g | Freq: Two times a day (BID) | ORAL | 1 refills | Status: AC | PRN

## 2019-04-08 MED ORDER — DOXEPIN HCL 75 MG PO CAPS: 75.0000 mg | ORAL_CAPSULE | Freq: Every day | ORAL | 2 refills | Status: AC

## 2019-04-08 MED ORDER — OMEPRAZOLE 40 MG PO CPDR: 40.0000 mg | DELAYED_RELEASE_CAPSULE | Freq: Every day | ORAL | 3 refills | Status: DC

## 2019-04-08 NOTE — Assessment & Plan Note (Addendum)
Patient with workup by GI that they say is inconclusive with recommendation to consider pain management.  Patient said he also received significant workup in [place] and was told the same thing.  We discussed goal of symptom management and improving quality of life as opposed to further significant investment in diagnostics.  We will start with doxepin, continue omeprazole, add MiraLAX as needed to 1 soft bowel movement per day.  Patient will check back in in 2 weeks.

## 2019-04-08 NOTE — Progress Notes (Signed)
    Subjective:  Patrick Reed is a 39 y.o. male who presents to the [facility] today with a chief complaint of chronic abdominal pain.   HPI: Patient with workup by GI that they say is inconclusive with recommendation to consider pain management.  Patient said he also received significant workup in [place] and was told the same thing. This has been an ongoing issue for more than 10 years.  Mild occasionally fluctuating in severity belly pain.  Not particularly timed to any bowel movements or eating.  No known alleviating or aggravating at this point.  Objective:  Physical Exam: BP 116/74   Pulse 75   SpO2 98%   Gen: NAD, conversing comfortably CV: Regular rate Pulm: NWOB, no cough GI: Normal bowel sounds present. Soft, mild tenderness to deep palpation, no rebound, nonsurgical belly, Nondistended. MSK: no edema, cyanosis, or clubbing noted Skin: warm, dry Neuro: grossly normal, moves all extremities Psych: Normal affect and thought content  No results found for this or any previous visit (from the past 72 hour(s)).   Assessment/Plan:  Chronic abdominal pain Patient with workup by GI that they say is inconclusive with recommendation to consider pain management.  Patient said he also received significant workup in [place] and was told the same thing.  We discussed goal of symptom management and improving quality of life as opposed to further significant investment in diagnostics.  We will start with doxepin, continue omeprazole, add MiraLAX as needed to 1 soft bowel movement per day.  Patient will check back in in 2 weeks.  Refugee health examination Patient mentions that while he is established here, his wife is established with the health department and he would like her to become part of our clinic.  Dr. Tarry Reed has agreed to assume her care as PCP and Dr. Owens Reed has agreed to attempt to arrange a establishing visit with our refugee clinic on 26 January.  Our referral coordinator, [name] will call and coordinate.   Patrick Sires, DO FAMILY MEDICINE RESIDENT - PGY3 04/08/2019 2:08 PM

## 2019-04-08 NOTE — Assessment & Plan Note (Signed)
Patient mentions that while he is established here, his wife is established with the health department and he would like her to become part of our clinic.  Dr. Tarry Kos has agreed to assume her care as PCP and Dr. Owens Shark has agreed to attempt to arrange a establishing visit with our refugee clinic on 26 January.  Our referral coordinator, Ms. Suzan Slick will call and coordinate.

## 2019-06-29 ENCOUNTER — Encounter: Payer: Self-pay | Admitting: Family Medicine

## 2019-06-29 ENCOUNTER — Ambulatory Visit (INDEPENDENT_AMBULATORY_CARE_PROVIDER_SITE_OTHER): Payer: Medicaid Other | Admitting: Family Medicine

## 2019-06-29 ENCOUNTER — Other Ambulatory Visit: Payer: Self-pay

## 2019-06-29 VITALS — BP 122/68 | HR 88 | Ht 66.0 in | Wt 169.4 lb

## 2019-06-29 DIAGNOSIS — F329 Major depressive disorder, single episode, unspecified: Secondary | ICD-10-CM | POA: Diagnosis not present

## 2019-06-29 DIAGNOSIS — R4589 Other symptoms and signs involving emotional state: Secondary | ICD-10-CM

## 2019-06-29 MED ORDER — DULOXETINE HCL 20 MG PO CPEP: 20.0000 mg | ORAL_CAPSULE | Freq: Every day | ORAL | 1 refills | Status: DC

## 2019-06-29 MED ORDER — FLUOXETINE HCL 20 MG PO TABS: 20.0000 mg | ORAL_TABLET | Freq: Every day | ORAL | 1 refills | Status: DC

## 2019-06-29 NOTE — Patient Instructions (Signed)
Therapy and Counseling Resources Most providers on this list will take Medicaid. Patients with commercial insurance or Medicare should contact their insurance company to get a list of in network providers.  [employer]  [address] [place], [place] [address]      [phone]  [place] [address], [address] [place], [place] [address]       [place] [address], [place], [place]    Jinny Blossom [facility] [address], [place], [place]  [facility]:  [place]. [place]  [facility]:  [place] [address] Loni Muse, [place]  [facility] (under & uninsured) [address] [place] [phone]    [email or web address]    [facility] [place]     Phone:  [phone]     [place]  [place] [address]      [place], [place] [address] [place]: [place], [place], [place]   [place] ([place] therapist) [address] [place] [address]   [place] [address]    [phone]    [facility]   [place],  [place], [place]   [place]  [place]  [facility]  [address] [place], [place]        [phone]  [facility] In Clinic under & uninsured [place], To schedule an appointment call [phone]- [phone] [address], [place] [phone]):  [place] - Fri from 8 AM - 3 PM  [facility] [place],  ([place])   [place], [place]: [phone]) 8:30 - 12; 1 - 2:30  Family Service of the [place],  [place], [place]    [phone]):8:30 -  12; 2 - 3PM  [facility] [place],  [address],  [place]; [phone]):   Mon - Fri 8 AM - 5 PM  [facility] [place]  MWF 12:30 to 3:00 or call to schedule an appointment  [phone]  Specific Provider options Psychology Today  [email or web address] 1. click on find a therapist  2. enter your zip code 3. left side and select or tailor a therapist for your specific need.   [facility] Provider Directory [email or web address]  (Medicaid)   Follow all drop down to find a provider  [place] or [email or web address] [address], [place], [place] Recovery support and educational   In home counseling [place] Telephone: [phone]  office in [email or web address] [email or web address]   Does not take reg. Medicaid or Medicare private insurance [facility], [place] health Choice, [facility], [place], [place], [place], [place] Health Choice  24- Hour Availability:  . [place] or [phone]  . Family Service of the [employer] [phone]  [facility] Crisis Service  [phone]   . [place]  [phone] (after hours)  . [facility]/Mobile Crisis   [phone]  . [place] National Suicide Hotline  6136158342 ([place])  . Call 911 or go to emergency room  . [employer]  (  [phone]);  [place] and [employer]   . [facility] ACCESS  [phone]); [place], [place], [place], [place], [place], [place], [place]    If you are feeling suicidal or depression symptoms worsen please immediately go to:   24 Hour Availability [facility]  [address] [place], [place] [address]  [phone] or [phone]  . If you are thinking about harming yourself or having thoughts of suicide, or if you know someone who is, seek  help right away. . Call your doctor or mental health care provider. . Call 911 or go to a hospital emergency room to get immediate help, or ask a friend or family member to help you do these things. . Call the [place] National Suicide Prevention Lifeline's toll-free, 24-hour hotline at 1-800-273-TALK 3522779476) or TTY: 1-800-799-4 TTY 878-267-4306) to talk to a trained counselor. . If you are in crisis, make sure you are not left alone.  . If someone else is in crisis, make sure he or she is not left alone   Family Service of the [employer] (Domestic Violence, Rape & Victim Assistance [phone]  [facility]  [address], [place]  [address]               [phone] or [phone]  [place]    (ONLY from 8am-4pm)    [phone]  The[facility] Alternative Mobile Crisis Unit (24/7)   [phone]  [place] National Suicide Hotline   (650) [phone] Diamantina Monks)

## 2019-06-30 ENCOUNTER — Other Ambulatory Visit: Payer: Self-pay

## 2019-06-30 ENCOUNTER — Telehealth: Payer: Self-pay

## 2019-06-30 DIAGNOSIS — F329 Major depressive disorder, single episode, unspecified: Secondary | ICD-10-CM | POA: Insufficient documentation

## 2019-06-30 DIAGNOSIS — R4589 Other symptoms and signs involving emotional state: Secondary | ICD-10-CM | POA: Insufficient documentation

## 2019-06-30 MED ORDER — FLUOXETINE HCL 20 MG PO TABS: 20.0000 mg | ORAL_TABLET | Freq: Every day | ORAL | 1 refills | Status: DC

## 2019-06-30 NOTE — Progress Notes (Signed)
    SUBJECTIVE:   CHIEF COMPLAINT / HPI: Depressive symptoms related to chronic abdominal pain  Patient has had some improvement with the doxepin that he was given at his last visit, however his abdominal pain has not changed.  That is still undifferentiated given he has had complete work-ups in 2 different countries at this point.  We discussed that there may be a psychological component to this as well as the abdominal pain may be influencing his psychological health.  Patient said that he is not a danger but that he has started to develop a lack of joy in life and does not particularly want to do any activities.  Sometimes he wishes he could just lay in bed and sleep all day.  He is willing to establish with a therapist and start medication.  PERTINENT  PMH / PSH: Chronic abdominal pain  OBJECTIVE:   BP 122/68   Pulse 88   Ht 5\' 6"  (1.676 m)   Wt 169 lb 6.4 oz (76.8 kg)   SpO2 100%   BMI 27.34 kg/m   General: Patient is in no distress and is alert and appropriately discussing his issues Respiratory : Regular rate, no increased work of breathing, no cough Cardiac: Regular rate  ASSESSMENT/PLAN:   Symptoms of depression Patient is safe but is experiencing depressive symptoms.  Medication reviewed and risk discussed with patient for potentially starting an SSRI.  We discussed positive or negative symptoms, discussed ED precautions for suicide ideation which he does not have at this time.  Given list of therapy resources in town and prescribed SSRI, will see [place] again in a few weeks     Sherene Sires, [place]

## 2019-06-30 NOTE — Assessment & Plan Note (Signed)
Patient is safe but is experiencing depressive symptoms.  Medication reviewed and risk discussed with patient for potentially starting an SSRI.  We discussed positive or negative symptoms, discussed ED precautions for suicide ideation which he does not have at this time.  Given list of therapy resources in town and prescribed SSRI, will see [place] again in a few weeks

## 2019-06-30 NOTE — Telephone Encounter (Signed)
Received fax from pharmacy requesting a prior authorization for Fluoxetine Tabs. Medicaid will pay for Fluoxetine Caps. Can we change this prescription? Please advise.

## 2019-07-03 NOTE — Telephone Encounter (Signed)
Please send in Fluoxetine in capsule form so his medication will be covered by his insurance.

## 2019-07-05 MED ORDER — FLUOXETINE HCL 20 MG PO CAPS: 20.0000 mg | ORAL_CAPSULE | Freq: Every day | ORAL | 0 refills | Status: AC

## 2019-07-05 NOTE — Addendum Note (Signed)
Addended by: Coral Else on: 07/05/2019 06:39 PM   Modules accepted: Orders

## 2019-07-07 ENCOUNTER — Other Ambulatory Visit: Payer: Self-pay

## 2019-07-07 ENCOUNTER — Ambulatory Visit (INDEPENDENT_AMBULATORY_CARE_PROVIDER_SITE_OTHER): Payer: Medicaid Other

## 2019-07-07 DIAGNOSIS — Z111 Encounter for screening for respiratory tuberculosis: Secondary | ICD-10-CM

## 2019-07-07 NOTE — Progress Notes (Signed)
Patient is here for PPD placement.  It was placed on 07/07/2019 in the left forearm @ 1045 am. Wheel noted. Site unremarkable.   Patient scheduled for PPD read on 4/15 at 1045. Given reminder card.    Talbot Grumbling, RN

## 2019-07-09 ENCOUNTER — Ambulatory Visit (INDEPENDENT_AMBULATORY_CARE_PROVIDER_SITE_OTHER): Payer: Medicaid Other

## 2019-07-09 ENCOUNTER — Other Ambulatory Visit: Payer: Self-pay

## 2019-07-09 DIAGNOSIS — Z111 Encounter for screening for respiratory tuberculosis: Secondary | ICD-10-CM

## 2019-07-09 LAB — TB SKIN TEST
Induration: 0 mm
TB Skin Test: NEGATIVE

## 2019-07-09 NOTE — Progress Notes (Signed)
Patient is here for a PPD read.  It was placed on 07/07/2019 in the left forearm @ 1045 am    PPD RESULTS:  Result: negative Induration: 0 mm  Letter created and given to patient for documentation purposes. Talbot Grumbling, RN

## 2019-08-28 IMAGING — CT CT ABD-PELV W/ CM
2 of 4 series · 15 of 46 positions shown, 17 images · IV contrast (iopamidol)
Comparison: None.

CLINICAL DATA: Generalized abdominal pain.

EXAM:
CT ABDOMEN AND PELVIS WITH CONTRAST
TECHNIQUE: Multidetector CT imaging of the abdomen and pelvis was performed
using the standard protocol following bolus administration of
intravenous contrast.
CONTRAST:  100mL 5BYHLP-T99 IOPAMIDOL (5BYHLP-T99) INJECTION 61%

[Series 2: abd/pel w · axial · 0.67mm/px · z∈[-449,-24]mm · 12 of 93 slices shown, 14 images]
[im 4/93  soft-tissue]
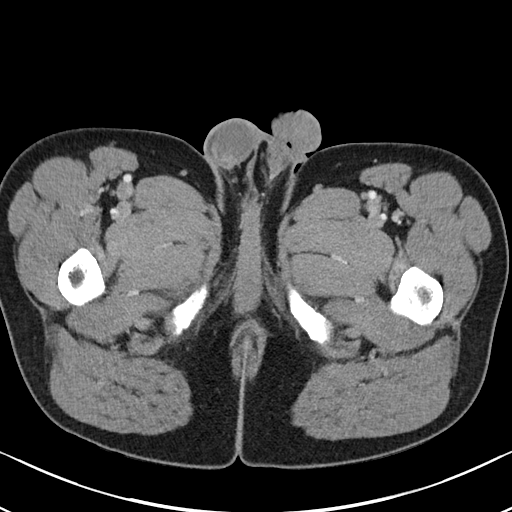
[im 4/93  bone]
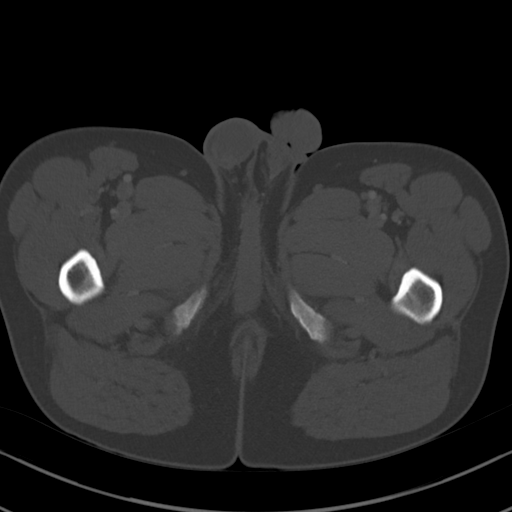
[im 12/93  soft-tissue]
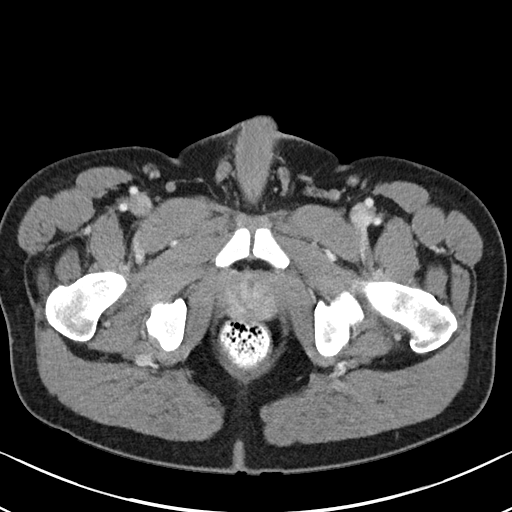
[im 20/93  soft-tissue]
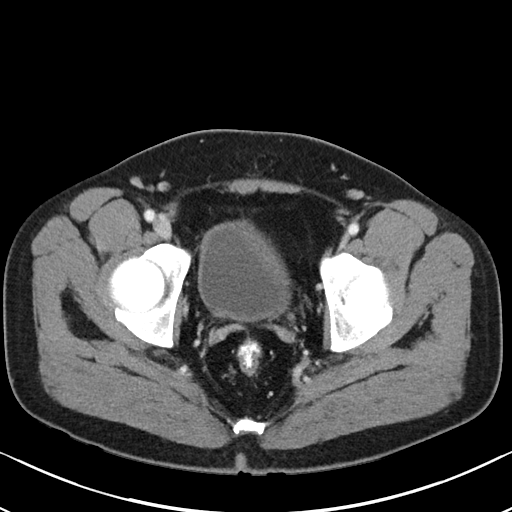
[im 27/93  soft-tissue]
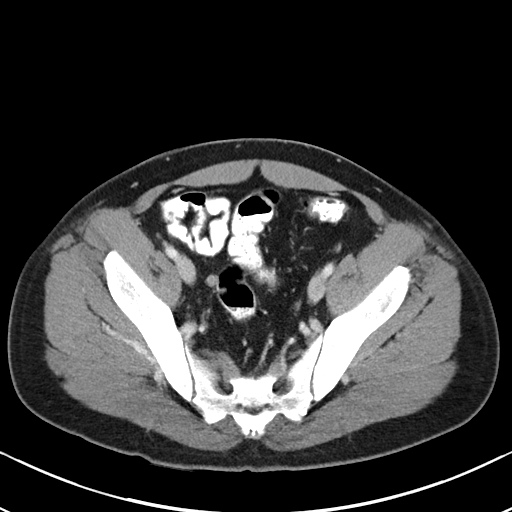
[im 35/93  soft-tissue]
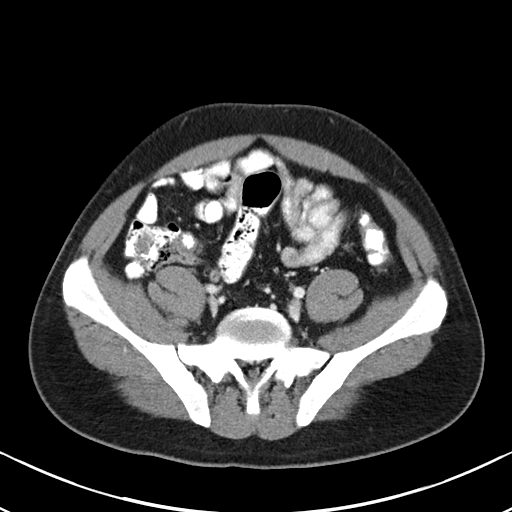
[im 43/93  soft-tissue]
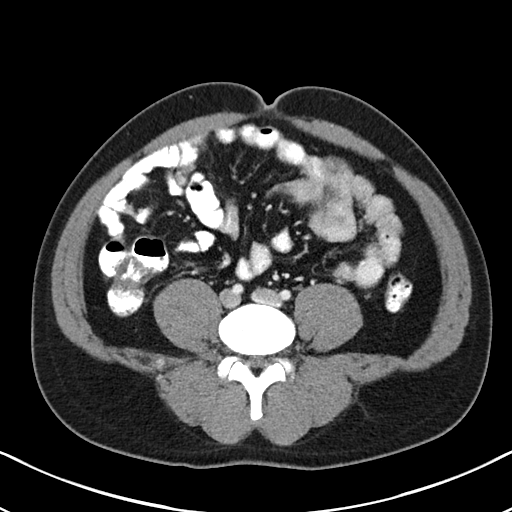
[im 50/93  soft-tissue]
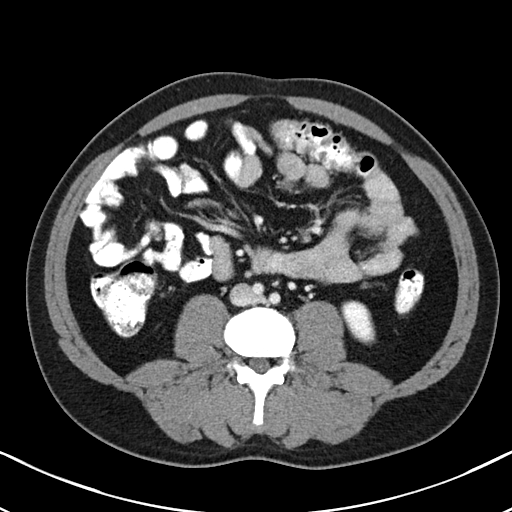
[im 58/93  soft-tissue]
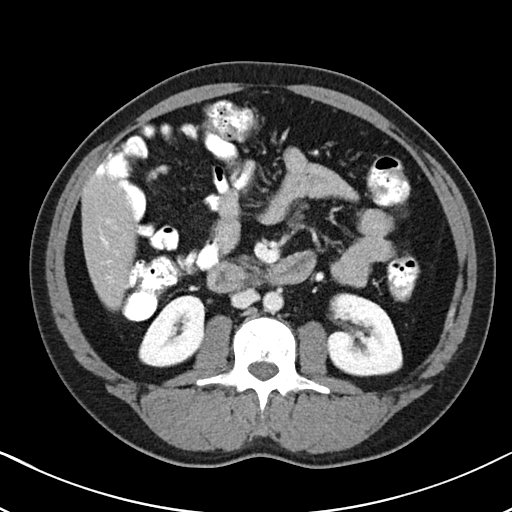
[im 66/93  soft-tissue]
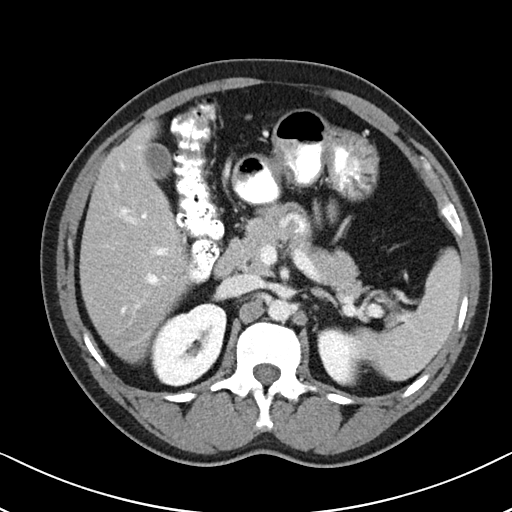
[im 66/93  bone]
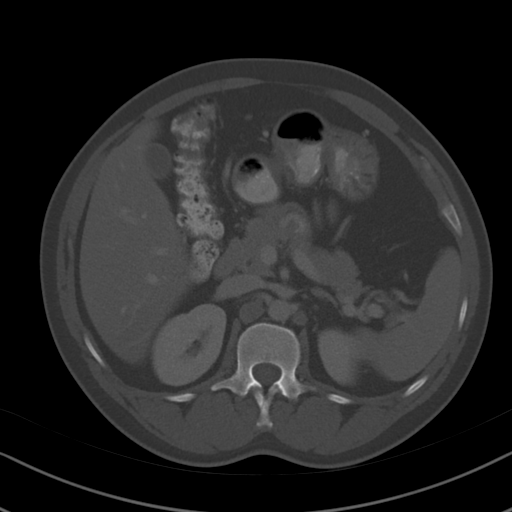
[im 73/93  soft-tissue]
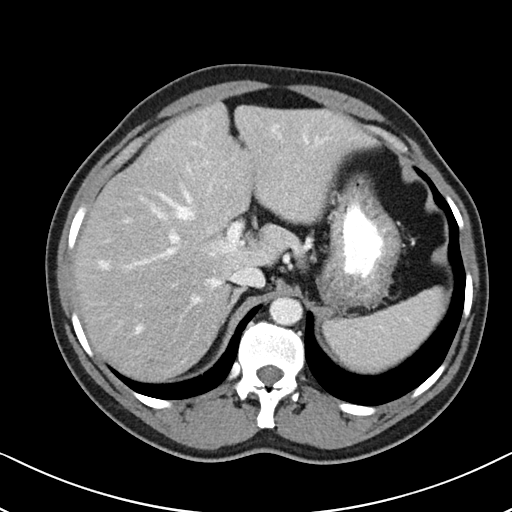
[im 81/93  soft-tissue]
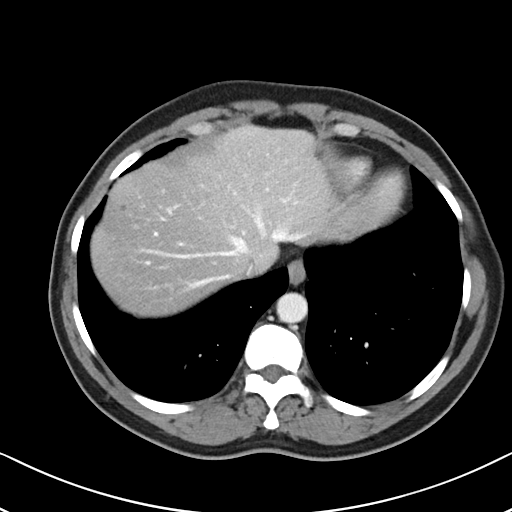
[im 89/93  soft-tissue]
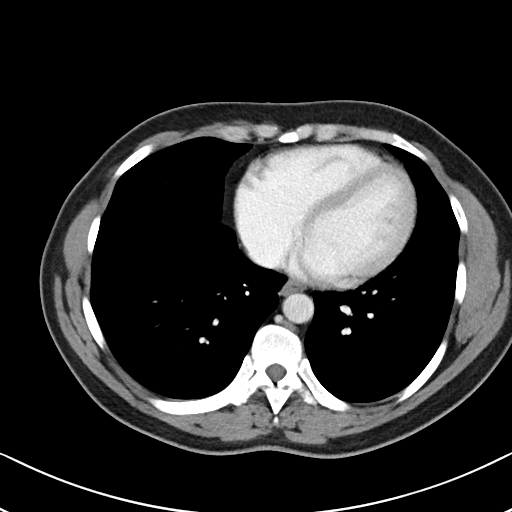

[Series 5: abd/pel w st · coronal · 0.64mm/px · 3 of 87 slices shown]
[im 29/87  soft-tissue]
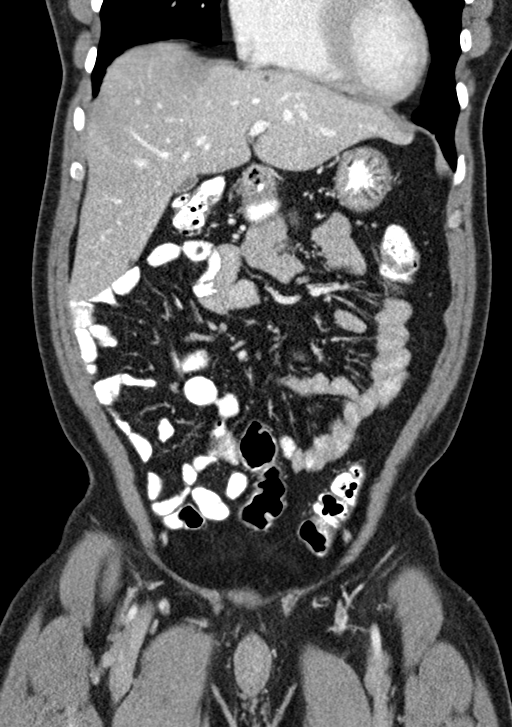
[im 39/87  soft-tissue]
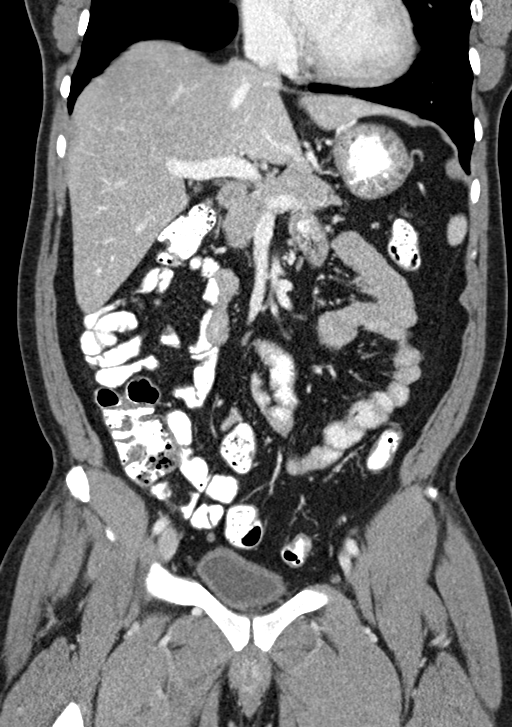
[im 48/87  soft-tissue]
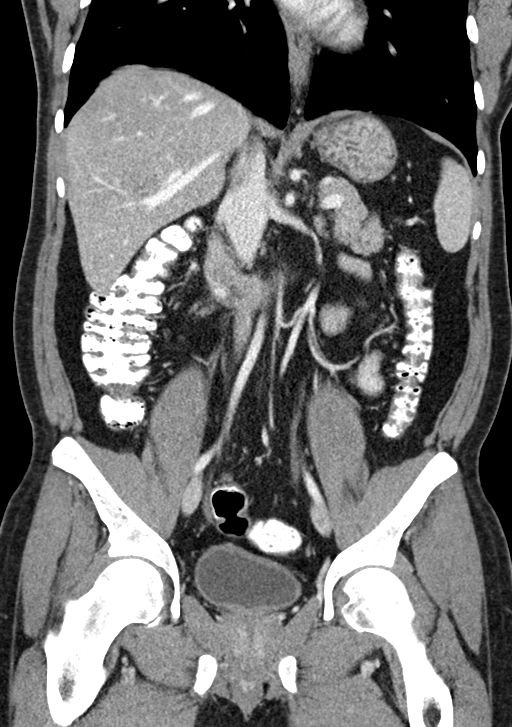

[15 of 46 positions shown; findings below may reference images not displayed]

FINDINGS: Lower chest: The lung bases are clear without focal nodule, mass, or
airspace disease. Heart size is normal. No significant pleural or
pericardial effusion is present.

Hepatobiliary: There is mild diffuse fatty infiltration of liver.
Subcentimeter hepatic cysts are present. No other mass lesion is
present. The common bile duct and gallbladder are within normal
limits.

Pancreas: Unremarkable. No pancreatic ductal dilatation or
surrounding inflammatory changes.

Spleen: Normal in size without focal abnormality.

Adrenals/Urinary Tract: Adrenal glands are normal bilaterally.
Kidneys and ureters are within normal limits. There is no stone or
mass lesion. No hydronephrosis is present. The urinary bladder is
within normal limits.

Stomach/Bowel: Stomach and duodenum are within normal limits. Small
bowel is unremarkable. Terminal ileum is normal. The appendix is
visualized and normal. The ascending and transverse colon are within
normal limits. Descending and sigmoid colon are normal as well.

Vascular/Lymphatic: No significant vascular findings are present. No
enlarged abdominal or pelvic lymph nodes.

Reproductive: Prostate is unremarkable.

Other: No abdominal wall hernia or abnormality. No abdominopelvic
ascites.

Musculoskeletal: Vertebral body heights alignment are maintained. No
focal lytic or blastic lesions are present. Bony pelvis intact. Hips
are located and within normal limits bilaterally.
IMPRESSION: 1. Slight hypoattenuation liver likely reflects hepatic steatosis.
No discrete lesions are present. Please correlate with liver
function tests.
2. Otherwise negative CT the abdomen. No other acute or focal lesion
to explain patient's symptoms.

## 2019-12-01 ENCOUNTER — Ambulatory Visit (HOSPITAL_COMMUNITY)
Admission: EM | Admit: 2019-12-01 | Discharge: 2019-12-01 | Disposition: A | Discharge status: Home / Self Care | Payer: Medicaid Other | Attending: Physician Assistant | Admitting: Physician Assistant

## 2019-12-01 ENCOUNTER — Encounter (HOSPITAL_COMMUNITY): Payer: Self-pay

## 2019-12-01 ENCOUNTER — Other Ambulatory Visit: Payer: Self-pay

## 2019-12-01 DIAGNOSIS — R109 Unspecified abdominal pain: Secondary | ICD-10-CM | POA: Insufficient documentation

## 2019-12-01 DIAGNOSIS — R1084 Generalized abdominal pain: Secondary | ICD-10-CM | POA: Diagnosis not present

## 2019-12-01 DIAGNOSIS — M545 Low back pain, unspecified: Secondary | ICD-10-CM

## 2019-12-01 DIAGNOSIS — G8929 Other chronic pain: Secondary | ICD-10-CM | POA: Diagnosis not present

## 2019-12-01 LAB — POCT URINALYSIS DIPSTICK, ED / UC
Bilirubin Urine: NEGATIVE
Glucose, UA: NEGATIVE mg/dL
Ketones, ur: NEGATIVE mg/dL
Leukocytes,Ua: NEGATIVE
Nitrite: NEGATIVE
Protein, ur: NEGATIVE mg/dL
Specific Gravity, Urine: 1.025 (ref 1.005–1.030)
Urobilinogen, UA: 0.2 mg/dL (ref 0.0–1.0)
pH: 6 (ref 5.0–8.0)

## 2019-12-01 MED ORDER — OMEPRAZOLE 40 MG PO CPDR: 40.0000 mg | DELAYED_RELEASE_CAPSULE | Freq: Every day | ORAL | 0 refills | Status: AC

## 2019-12-01 MED ORDER — ACETAMINOPHEN 325 MG PO TABS: 650.0000 mg | ORAL_TABLET | Freq: Four times a day (QID) | ORAL | 0 refills | Status: AC | PRN

## 2019-12-01 MED ORDER — MELOXICAM 7.5 MG PO TABS: 7.5000 mg | ORAL_TABLET | Freq: Every day | ORAL | 0 refills | Status: AC

## 2019-12-01 NOTE — ED Triage Notes (Signed)
Pt is here with continuous abdominal pain that started last Friday, pt has not taken any meds to relieve discomfort.

## 2019-12-01 NOTE — ED Provider Notes (Signed)
[place]    CSN: [id number] Arrival date & time: 12/01/19  1032      History   Chief Complaint Chief Complaint  Patient presents with  . Abdominal Pain    HPI Patrick Reed is a 39 y.o. male.   Patient with history of chronic abdominal pain and chronic back pain presents for abdominal pain and back pain.  He reports this is been a long and ongoing issue for at least 10 years dating back to prior to immigration from [place].  He reports he has pain in his right lower belly and left upper belly.  He reports the pain in his right lower belly seems to radiate to his back.  He described as a burning sensation.  He reports this is the exact same pain he is dealt with over the years.  He states he was doing well for several months and the pain did not been present until the last several days.  He reports pain got fairly severe a few days ago and has not improved much.  He does report improvement when laying flat on his belly or placing pressure there is a pain.  Food does not make the pain better or worse.  He has not had any nausea or vomiting.  He is moving his bowels per usual with a normal bowel movement today.  No diarrhea.  No dark tarry stool or blood in the stool.  He reports good appetite.  Normal urinary output.  Denies any frequency, pain or urgency.  Denies any blood in the urine.  Has not had any fevers or chills.  No traumas to the belly.  He has not tried any medications for the pain.  He reports he has been trialed on medications in the past that not seem to help much.  He was followed by his primary care and GI specialist.  Has not seen them for most recent episode.     History reviewed. No pertinent past medical history.  Patient Active Problem List   Diagnosis Date Noted  . Symptoms of depression 06/30/2019  . Infective otitis externa of right ear 06/08/2018  . Acute otitis media 06/08/2018  . Chronic abdominal pain 06/08/2018  . Chronic right-sided low back  pain without sciatica 06/08/2018  . Elevated transaminase level 06/08/2018  . Acute pain of left shoulder 02/20/2018  . Refugee health examination 11/05/2017  . Immigrant with language difficulty 11/05/2017  . Generalized abdominal pain 11/05/2017  . Cerumen debris on tympanic membrane of both ears 11/05/2017    History reviewed. No pertinent surgical history.     Home Medications    Prior to Admission medications   Medication Sig Start Date End Date Taking? Authorizing Provider  acetaminophen (TYLENOL) 325 MG tablet Take 2 tablets (650 mg total) by mouth every 6 (six) hours as needed. 12/01/19   Orson Rho, Marguerita Beards, PA-C  doxepin (SINEQUAN) 75 MG capsule Take 1 capsule (75 mg total) by mouth at bedtime. 04/08/19 07/07/19  Sherene Sires, DO  FLUoxetine (PROZAC) 20 MG capsule Take 1 capsule (20 mg total) by mouth daily. 07/05/19   Sherene Sires, DO  meloxicam (MOBIC) 7.5 MG tablet Take 1 tablet (7.5 mg total) by mouth daily for 14 days. 12/01/19 12/15/19  Mathea Frieling, Marguerita Beards, PA-C  omeprazole (PRILOSEC) 40 MG capsule Take 1 capsule (40 mg total) by mouth daily for 14 days. Take 30-60 minutes before breakfast. 12/01/19 12/15/19  Tyson Parkison, Marguerita Beards, PA-C  polyethylene glycol powder (GLYCOLAX/MIRALAX) 17 GM/SCOOP powder  Take 17 g by mouth 2 (two) times daily as needed. 04/08/19   Sherene Sires, DO    Family History Family History  Problem Relation Age of Onset  . Healthy Mother   . Healthy Father     Social History Social History   Tobacco Use  . Smoking status: Never Smoker  . Smokeless tobacco: Never Used  Vaping Use  . Vaping Use: Never used  Substance Use Topics  . Alcohol use: Never  . Drug use: Never     Allergies   Patient has no known allergies.   Review of Systems Review of Systems   Physical Exam Triage Vital Signs ED Triage Vitals  Enc Vitals Group     BP      Pulse      Resp      Temp      Temp src      SpO2      Weight      Height      Head Circumference      Peak Flow        Pain Score      Pain Loc      Pain Edu?      Excl. in [place]?    No data found.  Updated Vital Signs BP 130/80 (BP Location: Right Arm)   Pulse 75   Temp (!) 97.5 F (36.4 C) (Oral)   Resp 18   SpO2 95%   Visual Acuity Right Eye Distance:   Left Eye Distance:   Bilateral Distance:    Right Eye Near:   Left Eye Near:    Bilateral Near:     Physical Exam Vitals and nursing note reviewed.  Constitutional:      General: He is not in acute distress.    Appearance: He is well-developed. He is not ill-appearing.  HENT:     Head: Normocephalic and atraumatic.  Eyes:     Conjunctiva/sclera: Conjunctivae normal.  Cardiovascular:     Rate and Rhythm: Normal rate and regular rhythm.     Heart sounds: No murmur heard.   Pulmonary:     Effort: Pulmonary effort is normal. No respiratory distress.     Breath sounds: Normal breath sounds.  Abdominal:     General: There is no distension.     Palpations: Abdomen is soft.     Tenderness: There is generalized abdominal tenderness (There is generalized tenderness, however emphasis in the right lower, right flank and left upper quadrants.). There is no right CVA tenderness, left CVA tenderness, guarding or rebound.     Comments: Abdomen is soft.  Musculoskeletal:     Cervical back: Neck supple.     Comments: Mild tenderness of the right-sided paralumbar spinal musculature.  No midline tenderness.  Patient is ambulatory.  Strength 5/5.  Sensation intact.  Skin:    General: Skin is warm and dry.  Neurological:     Mental Status: He is alert.      UC Treatments / Results  Labs (all labs ordered are listed, but only abnormal results are displayed) Labs Reviewed  POCT URINALYSIS DIPSTICK, ED / UC - Abnormal; Notable for the following components:      Result Value   Hgb urine dipstick TRACE (*)    All other components within normal limits  URINE CULTURE    EKG   Radiology No results found.  Procedures Procedures  (including critical care time)  Medications Ordered in UC Medications - No data to  display  Initial Impression / Assessment and Plan / UC Course  I have reviewed the triage vital signs and the nursing notes.  Pertinent labs & imaging results that were available during my care of the patient were reviewed by me and considered in my medical decision making (see chart for details).     #Chronic abdominal pain #Chronic right-sided low back pain Patient is a 39 year old with history of chronic abdominal pain and chronic right-sided low back pain resenting with similar symptoms.  Per chart review patient has had nearly identical presentations to primary care earlier this year.  He has had extensive GI work-up in the past with CT scans, endoscopies and colonoscopies that have found no pathology.  He was recommended for pain management at one point.  He is afebrile here without an acute abdomen and exam very consistent with previous.  UA with trace blood, though unlikely this is a renal stone.  No sign of infection.  Back pain seems musculoskeletal though could be referred as well.  Will trial him on Prilosec, Mobic and Tylenol for pain relief.  Instructed if pain became more severe he has nausea vomiting or fever that he needs to report to the emergency department.  Instructed him to call his primary care for close follow-up.  Patient verbalized agreement understanding plan of care Final Clinical Impressions(s) / UC Diagnoses   Final diagnoses:  Abdominal pain, chronic, generalized  Chronic right-sided low back pain without sciatica     Discharge Instructions     Take the medications as prescribed - prilosec daily - mobic daily - tylenol every 6 hours  Call your primary care provider today for close follow up  If worsening pain, vomiting, fever, go to the Emergency department      ED Prescriptions    Medication Sig Dispense Auth. Provider   meloxicam (MOBIC) 7.5 MG tablet Take 1  tablet (7.5 mg total) by mouth daily for 14 days. 14 tablet Geraldine Tesar, Marguerita Beards, PA-C   acetaminophen (TYLENOL) 325 MG tablet Take 2 tablets (650 mg total) by mouth every 6 (six) hours as needed. 30 tablet Mirielle Byrum, Marguerita Beards, PA-C   omeprazole (PRILOSEC) 40 MG capsule Take 1 capsule (40 mg total) by mouth daily for 14 days. Take 30-60 minutes before breakfast. 14 capsule Hilarie Sinha, Marguerita Beards, PA-C     PDMP not reviewed this encounter.   Purnell Shoemaker, PA-C 12/01/19 2229

## 2019-12-01 NOTE — Discharge Instructions (Addendum)
Take the medications as prescribed - prilosec daily - mobic daily - tylenol every 6 hours  Call your primary care provider today for close follow up  If worsening pain, vomiting, fever, go to the Emergency department

## 2019-12-02 LAB — URINE CULTURE: Culture: <10000 — AB

## 2020-01-07 ENCOUNTER — Ambulatory Visit (INDEPENDENT_AMBULATORY_CARE_PROVIDER_SITE_OTHER): Payer: Medicaid Other

## 2020-01-07 ENCOUNTER — Other Ambulatory Visit: Payer: Self-pay

## 2020-01-07 ENCOUNTER — Ambulatory Visit (HOSPITAL_COMMUNITY)
Admission: EM | Admit: 2020-01-07 | Discharge: 2020-01-07 | Disposition: A | Discharge status: Home / Self Care | Payer: Medicaid Other | Attending: Family Medicine | Admitting: Family Medicine

## 2020-01-07 ENCOUNTER — Encounter (HOSPITAL_COMMUNITY): Payer: Self-pay | Admitting: Emergency Medicine

## 2020-01-07 DIAGNOSIS — M545 Low back pain, unspecified: Secondary | ICD-10-CM

## 2020-01-07 DIAGNOSIS — M549 Dorsalgia, unspecified: Secondary | ICD-10-CM | POA: Diagnosis not present

## 2020-01-07 DIAGNOSIS — G8929 Other chronic pain: Secondary | ICD-10-CM | POA: Diagnosis not present

## 2020-01-07 DIAGNOSIS — R109 Unspecified abdominal pain: Secondary | ICD-10-CM | POA: Diagnosis not present

## 2020-01-07 DIAGNOSIS — R1031 Right lower quadrant pain: Secondary | ICD-10-CM

## 2020-01-07 DIAGNOSIS — R079 Chest pain, unspecified: Secondary | ICD-10-CM

## 2020-01-07 DIAGNOSIS — R0789 Other chest pain: Secondary | ICD-10-CM | POA: Diagnosis not present

## 2020-01-07 LAB — POCT URINALYSIS DIPSTICK, ED / UC
Bilirubin Urine: NEGATIVE
Glucose, UA: NEGATIVE mg/dL
Hgb urine dipstick: NEGATIVE
Ketones, ur: NEGATIVE mg/dL
Leukocytes,Ua: NEGATIVE
Nitrite: NEGATIVE
Protein, ur: NEGATIVE mg/dL
Specific Gravity, Urine: >=1.03 (ref 1.005–1.030)
Urobilinogen, UA: 0.2 mg/dL (ref 0.0–1.0)
pH: 6 (ref 5.0–8.0)

## 2020-01-07 MED ORDER — MELOXICAM 15 MG PO TABS: 15.0000 mg | ORAL_TABLET | Freq: Every day | ORAL | 0 refills | Status: AC | PRN

## 2020-01-07 MED ORDER — CYCLOBENZAPRINE HCL 10 MG PO TABS: 10.0000 mg | ORAL_TABLET | Freq: Every evening | ORAL | 0 refills | Status: AC | PRN

## 2020-01-07 NOTE — ED Triage Notes (Signed)
Patient presents to [facility] for assessment of RLQ abdominal pain with radiation to the right flank.  Patient states the RLQ pain has been there "for years".  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  It also increased the RLQ and right flank pain.  He is now steady at a 7/10

## 2020-01-07 NOTE — ED Provider Notes (Signed)
[place]    CSN: [id number] Arrival date & time: 01/07/20  1421      History   Chief Complaint Chief Complaint  Patient presents with  . Back Pain  . Chest Pain  . Abdominal Pain    HPI Patrick Reed is a 39 y.o. male.   Here today for acute on chronic RLQ and right low back pain as well as chest pain that comes and goes and is located in center of chest. States these issues have been ongoing for several years and seem to come on when he's at work where he lifts heavy boxes often throughout the day. Relieving factor is laying down to rest. Denies hematuria, dysuria, bowel changes, fever, chills, unexpected weight loss, numbness tingling or radiation of pain, nausea, vomiting. Not currently taking anything for sxs. Of note, per chart review has had numerous extensive workups from PCP, GI, and at [facility] setting for this very issue all without abnormal findings though did have trace RBCs in urine last month. He is not aware of any hx of kidney stones or other chronic issues.      History reviewed. No pertinent past medical history.  Patient Active Problem List   Diagnosis Date Noted  . Symptoms of depression 06/30/2019  . Infective otitis externa of right ear 06/08/2018  . Acute otitis media 06/08/2018  . Chronic abdominal pain 06/08/2018  . Chronic right-sided low back pain without sciatica 06/08/2018  . Elevated transaminase level 06/08/2018  . Acute pain of left shoulder 02/20/2018  . Refugee health examination 11/05/2017  . Immigrant with language difficulty 11/05/2017  . Generalized abdominal pain 11/05/2017  . Cerumen debris on tympanic membrane of both ears 11/05/2017    History reviewed. No pertinent surgical history.     Home Medications    Prior to Admission medications   Medication Sig Start Date End Date Taking? Authorizing Provider  acetaminophen (TYLENOL) 325 MG tablet Take 2 tablets (650 mg total) by mouth every 6 (six) hours as needed.  12/01/19   Darr, Marguerita Beards, PA-C  cyclobenzaprine (FLEXERIL) 10 MG tablet Take 1 tablet (10 mg total) by mouth at bedtime as needed for muscle spasms. DO NOT DRINK ALCOHOL OR DRIVE WHILE TAKING THIS MEDICATION 01/07/20   Volney American, PA-C  doxepin (SINEQUAN) 75 MG capsule Take 1 capsule (75 mg total) by mouth at bedtime. 04/08/19 07/07/19  Sherene Sires, DO  FLUoxetine (PROZAC) 20 MG capsule Take 1 capsule (20 mg total) by mouth daily. 07/05/19   Sherene Sires, DO  meloxicam (MOBIC) 15 MG tablet Take 1 tablet (15 mg total) by mouth daily as needed for pain. 01/07/20   Volney American, PA-C  omeprazole (PRILOSEC) 40 MG capsule Take 1 capsule (40 mg total) by mouth daily for 14 days. Take 30-60 minutes before breakfast. 12/01/19 12/15/19  Darr, Marguerita Beards, PA-C  polyethylene glycol powder (GLYCOLAX/MIRALAX) 17 GM/SCOOP powder Take 17 g by mouth 2 (two) times daily as needed. 04/08/19   Sherene Sires, DO    Family History Family History  Problem Relation Age of Onset  . Healthy Mother   . Healthy Father     Social History Social History   Tobacco Use  . Smoking status: Never Smoker  . Smokeless tobacco: Never Used  Vaping Use  . Vaping Use: Never used  Substance Use Topics  . Alcohol use: Never  . Drug use: Never     Allergies   Patient has no known allergies.   Review  of Systems Review of Systems PER HPI   Physical Exam Triage Vital Signs ED Triage Vitals  Enc Vitals Group     BP 01/07/20 1541 131/82     Pulse Rate 01/07/20 1541 69     Resp 01/07/20 1541 18     Temp 01/07/20 1541 98.4 F (36.9 C)     Temp Source 01/07/20 1541 Oral     SpO2 01/07/20 1541 100 %     Weight --      Height --      Head Circumference --      Peak Flow --      Pain Score 01/07/20 1542 7     Pain Loc --      Pain Edu? --      Excl. in [place]? --    No data found.  Updated Vital Signs BP 131/82 (BP Location: Right Arm)   Pulse 69   Temp 98.4 F (36.9 C) (Oral)   Resp 18   SpO2  100%   Visual Acuity Right Eye Distance:   Left Eye Distance:   Bilateral Distance:    Right Eye Near:   Left Eye Near:    Bilateral Near:     Physical Exam Vitals and nursing note reviewed.  Constitutional:      Appearance: Normal appearance.  HENT:     Head: Atraumatic.     Mouth/Throat:     Mouth: Mucous membranes are moist.     Pharynx: Oropharynx is clear.  Eyes:     Extraocular Movements: Extraocular movements intact.     Conjunctiva/sclera: Conjunctivae normal.  Cardiovascular:     Rate and Rhythm: Normal rate and regular rhythm.     Heart sounds: Normal heart sounds.  Pulmonary:     Effort: Pulmonary effort is normal.     Breath sounds: Normal breath sounds.  Abdominal:     General: Bowel sounds are normal. There is no distension.     Palpations: Abdomen is soft.     Tenderness: There is no abdominal tenderness. There is no guarding.  Musculoskeletal:        General: Tenderness (ttp right lateral lumbar region) present. No swelling. Normal range of motion.     Cervical back: Normal range of motion and neck supple.     Comments: No midline ttp, no deformity palpable, good ROM throughout  Skin:    General: Skin is warm and dry.     Findings: No bruising, erythema or rash.  Neurological:     General: No focal deficit present.     Mental Status: He is oriented to person, place, and time.     Sensory: No sensory deficit.     Motor: No weakness.     Gait: Gait normal.  Psychiatric:        Mood and Affect: Mood normal.        Thought Content: Thought content normal.        Judgment: Judgment normal.      UC Treatments / Results  Labs (all labs ordered are listed, but only abnormal results are displayed) Labs Reviewed  POCT URINALYSIS DIPSTICK, ED / UC    EKG   Radiology DG Abd 2 Views  Result Date: 01/07/2020 CLINICAL DATA:  Chronic right-sided flank pain EXAM: ABDOMEN - 2 VIEW COMPARISON:  None. FINDINGS: Scattered large and small bowel gas is  noted. Mild retained fecal material is seen without obstructive change. No free air is noted. No abnormal mass or abnormal  calcifications are seen. No bony abnormality is noted. IMPRESSION: No acute abnormality seen. Electronically Signed   By: Inez Catalina M.D.   On: 01/07/2020 16:16    Procedures Procedures (including critical care time)  Medications Ordered in UC Medications - No data to display  Initial Impression / Assessment and Plan / UC Course  I have reviewed the triage vital signs and the nursing notes.  Pertinent labs & imaging results that were available during my care of the patient were reviewed by me and considered in my medical decision making (see chart for details).     U/A benign, abdominal x-ray without abnormal findings and EKG without ST elevations or T wave changes and NSR. Strongly suspect his sxs are related to muscle strain and fatigue from the heavy lifting he routinely does at work. Recommended adjusting roles at work to something with less lifting, massage and exercises at home, and will treat with flexeril and meloxicam prn for sxs. Close PCP f/u recommended, and if CP worsens should contact outpatient Cardiology or go directly to ED.   Final Clinical Impressions(s) / UC Diagnoses   Final diagnoses:  RLQ abdominal pain  Chronic right-sided low back pain without sciatica  Chest wall pain   Discharge Instructions   None    ED Prescriptions    Medication Sig Dispense Auth. Provider   cyclobenzaprine (FLEXERIL) 10 MG tablet Take 1 tablet (10 mg total) by mouth at bedtime as needed for muscle spasms. DO NOT DRINK ALCOHOL OR DRIVE WHILE TAKING THIS MEDICATION 30 tablet Volney American, PA-C   meloxicam (MOBIC) 15 MG tablet Take 1 tablet (15 mg total) by mouth daily as needed for pain. 30 tablet Volney American, [place]     PDMP not reviewed this encounter.   Volney American, [place] 01/07/20 1754

## 2020-03-15 ENCOUNTER — Other Ambulatory Visit: Payer: Self-pay | Admitting: Family Medicine

## 2020-05-14 IMAGING — US ULTRASOUND ABDOMEN LIMITED
1 series · 13 of 25 positions shown · non-contrast
Comparison: None.

CLINICAL DATA: Right upper quadrant pain

EXAM:
ULTRASOUND ABDOMEN LIMITED RIGHT UPPER QUADRANT

[Series 1: ultrasound abdomen limited · 13 of 82 slices shown]
[im 1/82]
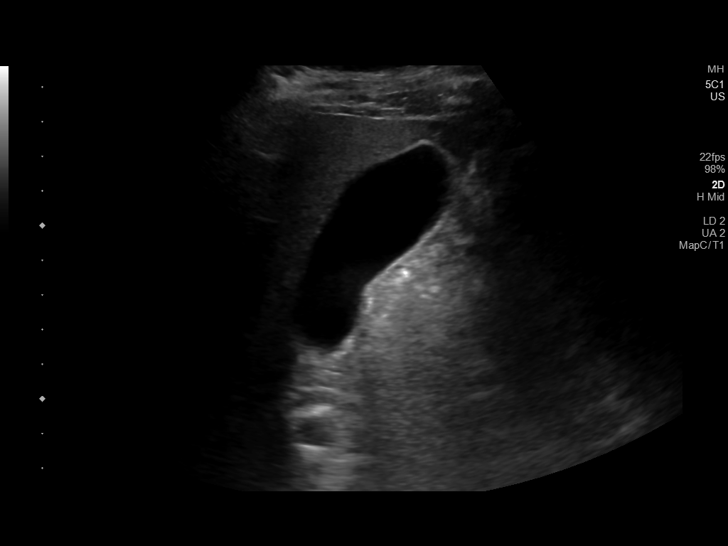
[im 7/82]
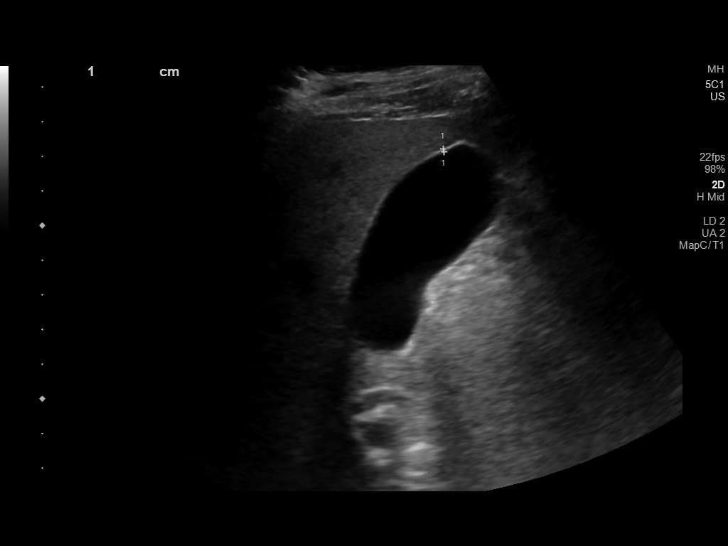
[im 14/82]
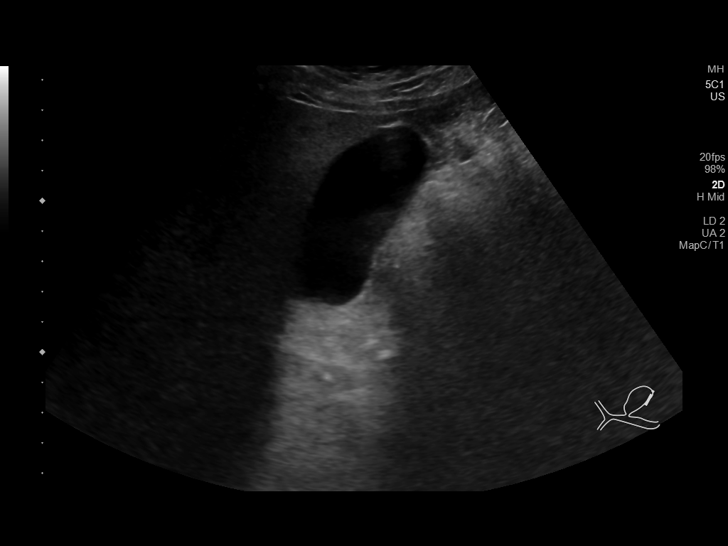
[im 21/82]
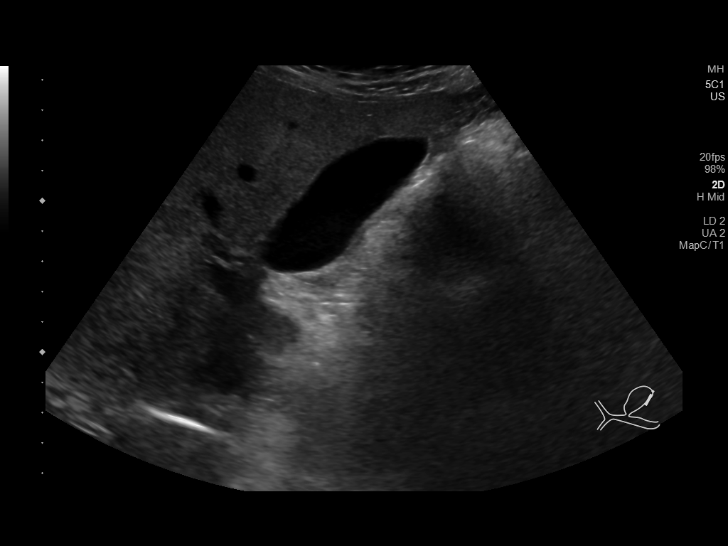
[im 28/82]
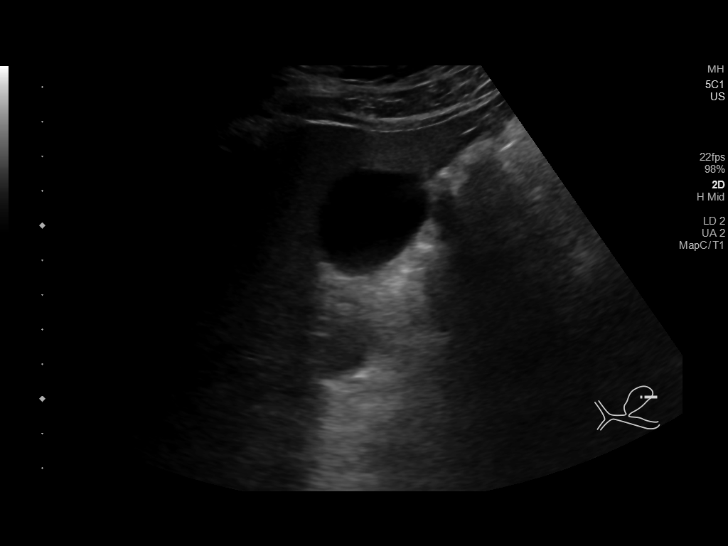
[im 34/82]
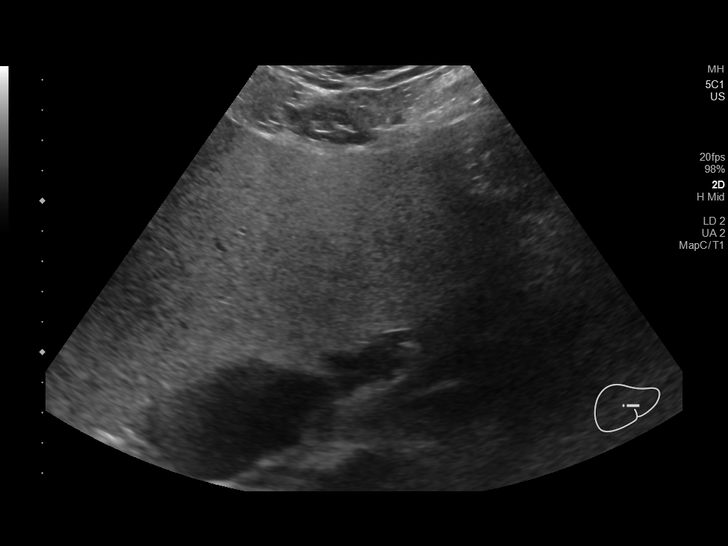
[im 41/82]
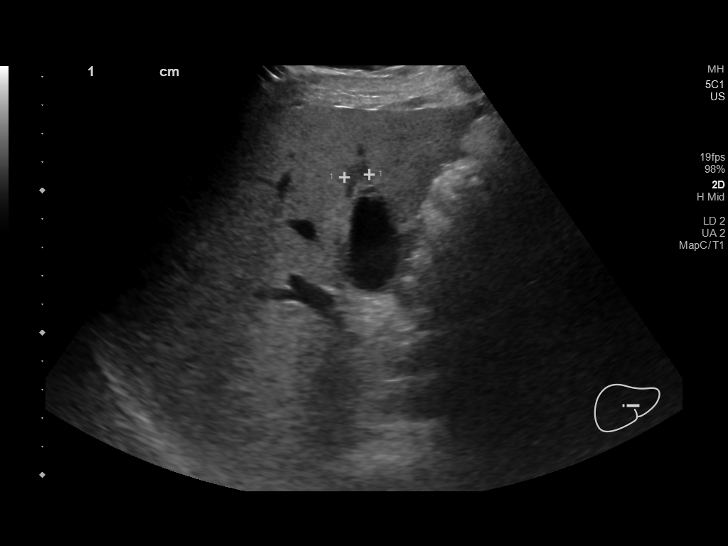
[im 48/82]
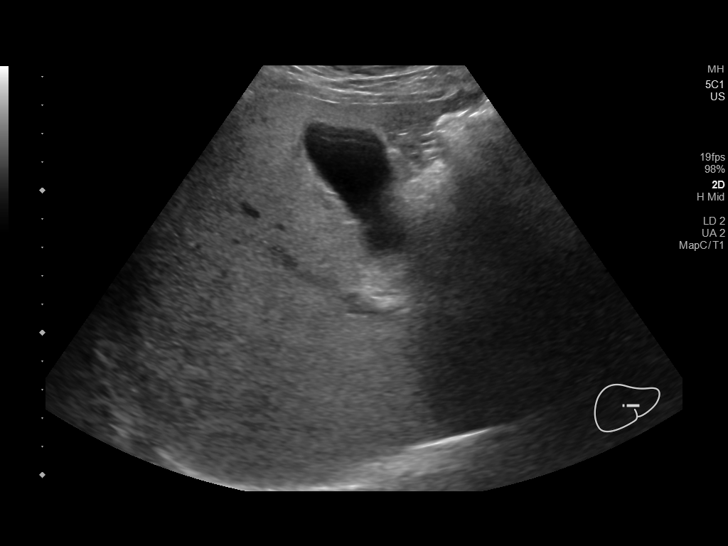
[im 55/82]
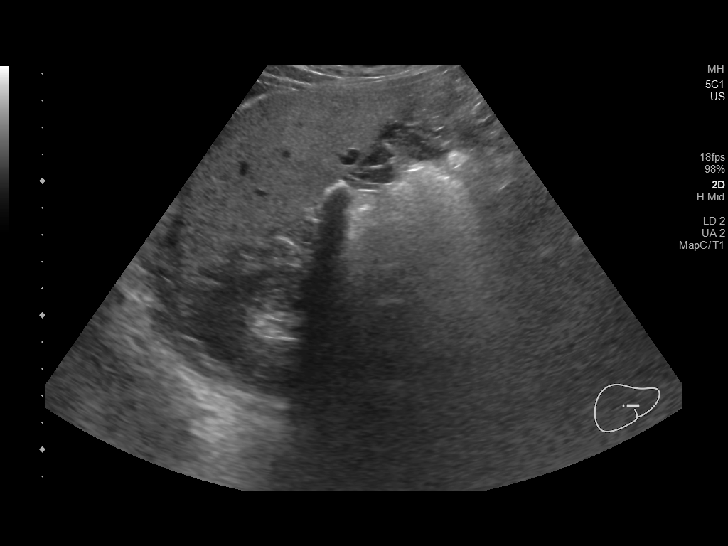
[im 61/82]
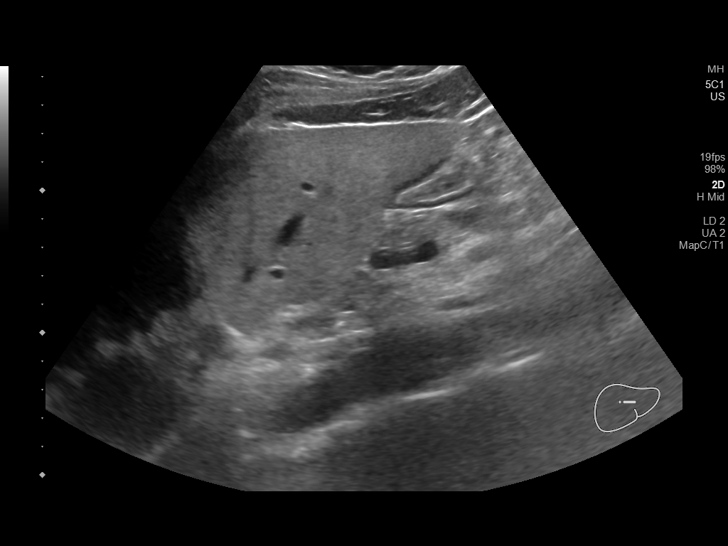
[im 68/82]
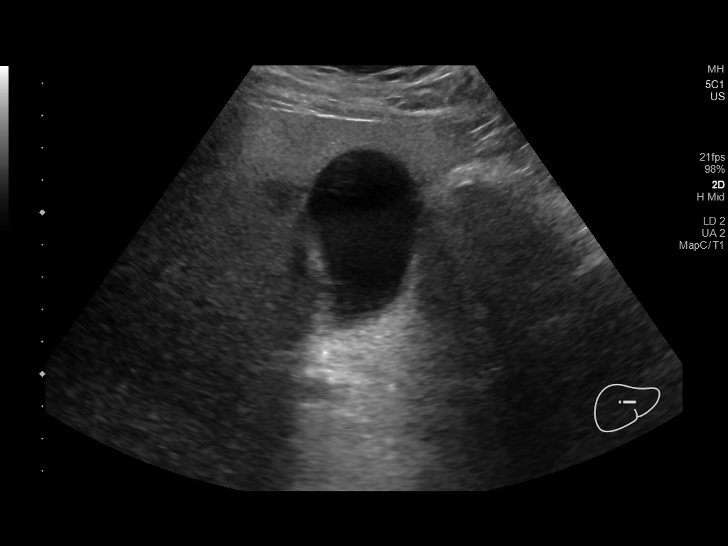
[im 75/82]
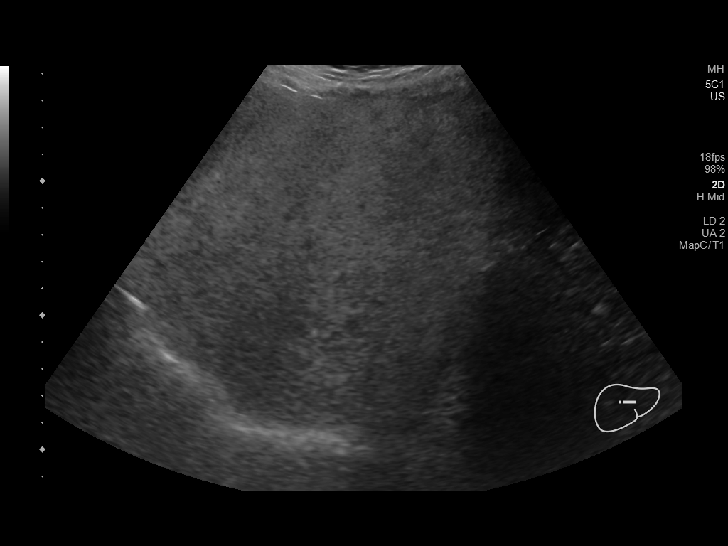
[im 82/82]
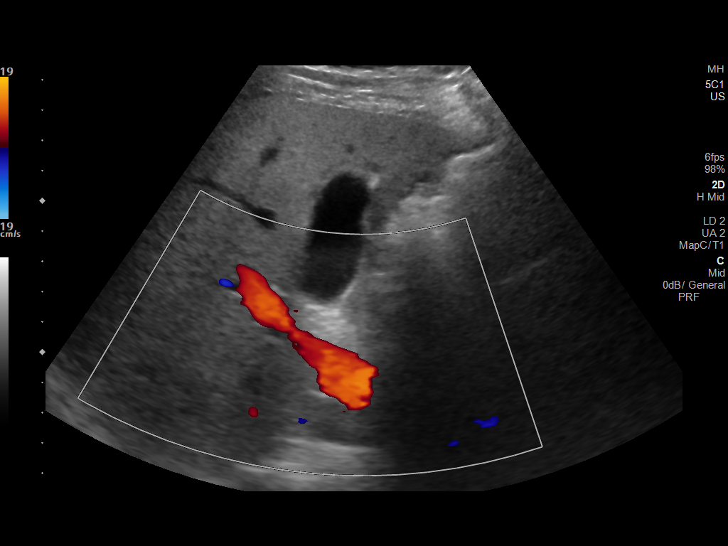

[13 of 25 positions shown; findings below may reference images not displayed]

FINDINGS: Gallbladder:

No gallstones or wall thickening visualized. There is no
pericholecystic fluid. No sonographic Murphy sign noted by
sonographer.

Common bile duct:

Diameter: 5 mm. No intrahepatic or extrahepatic biliary duct
dilatation.

Liver:

Liver echogenicity is increased diffusely. There is a cystic area in
the anterior segment right lobe of the liver measuring 0.7 x 0.8 x
0.7 cm. A cystic area on the right anteriorly measures 1.0 x 0.9 x
1.1 cm. A small cyst in the posterior segment right lobe of the
liver measures 0.8 x 0.7 x 0.7 cm. There is probable fatty sparing
near the gallbladder fossa. Portal vein is patent on color Doppler
imaging with normal direction of blood flow towards the liver.
IMPRESSION: Diffuse increase in liver echogenicity, a finding felt to be
indicative of hepatic steatosis. Small scattered cysts noted.
Probable mild fatty sparing near the gallbladder fossa noted. No
noncystic liver lesions are evident. It must be cautioned that the
sensitivity of ultrasound for detection of noncystic liver lesions
is diminished in this circumstance.

Study otherwise unremarkable.

## 2021-05-09 ENCOUNTER — Other Ambulatory Visit: Payer: Self-pay | Admitting: Physician Assistant

## 2021-05-09 DIAGNOSIS — R1011 Right upper quadrant pain: Secondary | ICD-10-CM

## 2021-05-11 ENCOUNTER — Other Ambulatory Visit: Payer: Self-pay | Admitting: Physician Assistant

## 2021-05-11 DIAGNOSIS — R1011 Right upper quadrant pain: Secondary | ICD-10-CM

## 2021-05-23 ENCOUNTER — Ambulatory Visit
Admission: RE | Admit: 2021-05-23 | Discharge: 2021-05-23 | Disposition: A | Discharge status: Home / Self Care | Payer: Medicaid Other | Source: Ambulatory Visit | Attending: Physician Assistant | Admitting: Physician Assistant

## 2021-05-23 DIAGNOSIS — R1011 Right upper quadrant pain: Secondary | ICD-10-CM

## 2021-05-24 ENCOUNTER — Ambulatory Visit
Admission: RE | Admit: 2021-05-24 | Discharge: 2021-05-24 | Disposition: A | Discharge status: Home / Self Care | Payer: Medicaid Other | Source: Ambulatory Visit | Attending: Physician Assistant | Admitting: Physician Assistant

## 2021-05-24 DIAGNOSIS — R1011 Right upper quadrant pain: Secondary | ICD-10-CM

## 2021-05-24 MED ORDER — IOPAMIDOL (ISOVUE-300) INJECTION 61%
100.0000 mL | Freq: Once | INTRAVENOUS | Status: AC | PRN
  Administered 2021-05-24: 100 mL via INTRAVENOUS

## 2021-08-29 ENCOUNTER — Encounter: Payer: Self-pay | Admitting: *Deleted

## 2021-09-25 IMAGING — DX DG ABDOMEN 2V
2 series · 2 of 2 positions shown · non-contrast
Comparison: None.

CLINICAL DATA: Chronic right-sided flank pain

EXAM:
ABDOMEN - 2 VIEW

[abdomen erect]
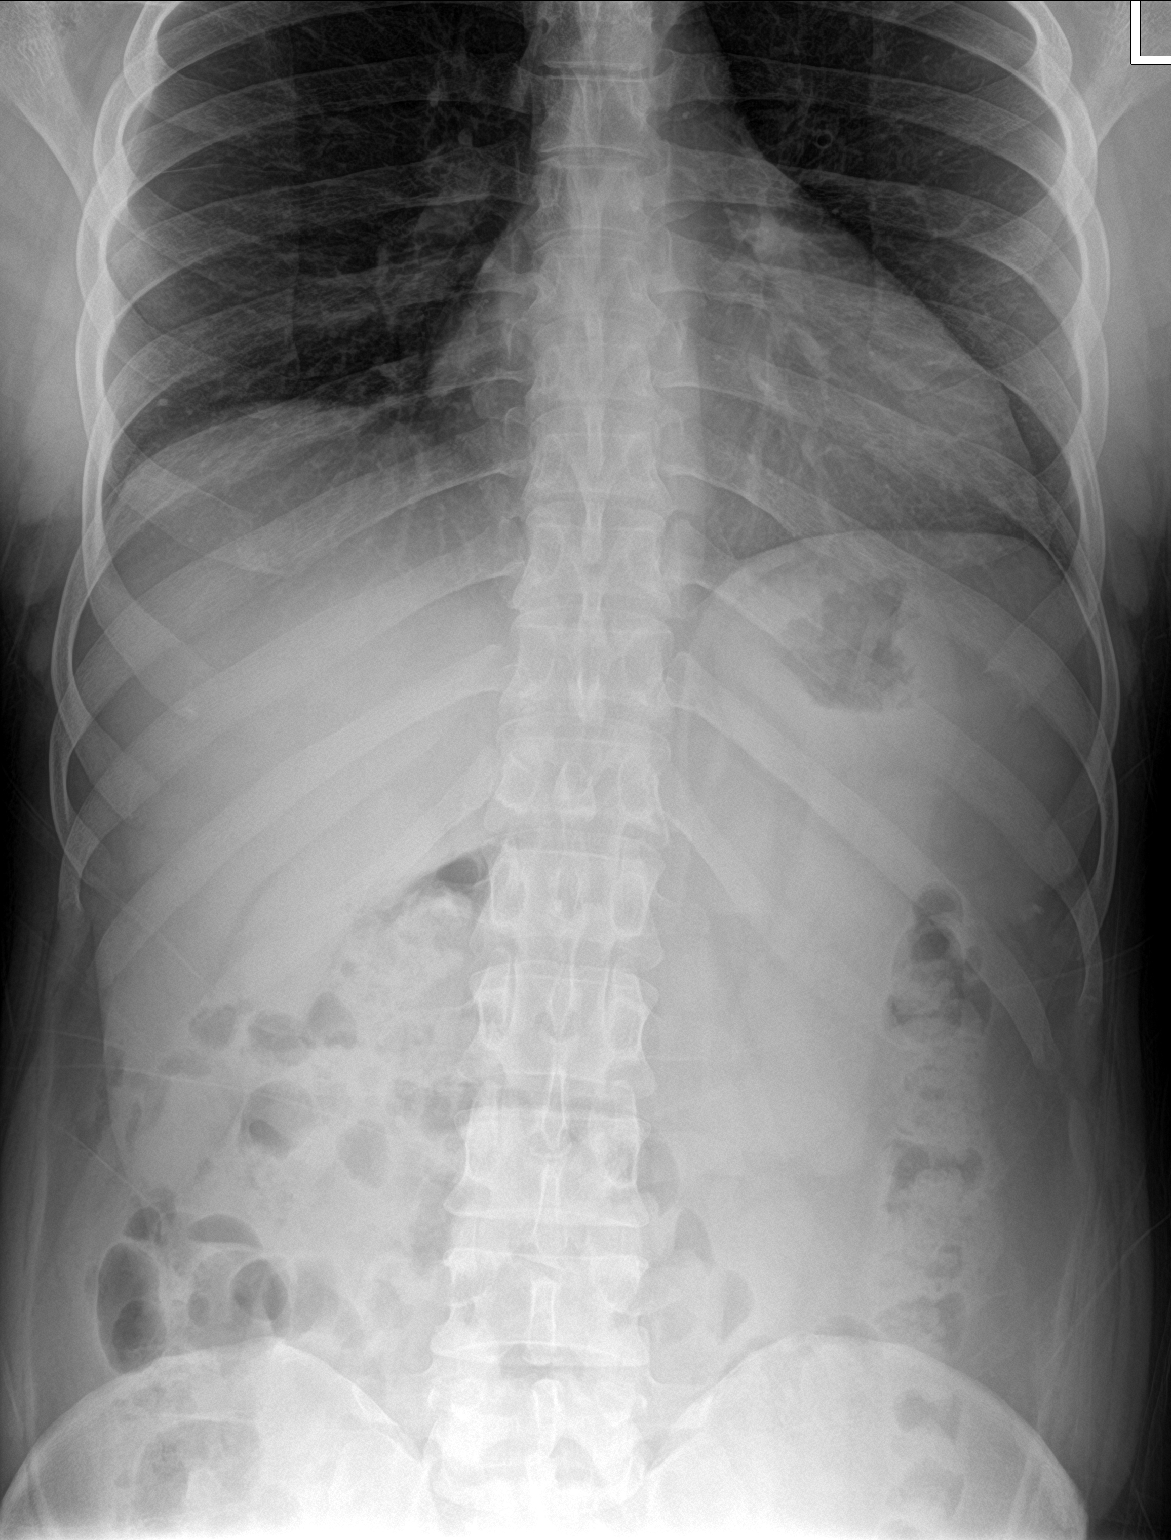

[abdomen supine]
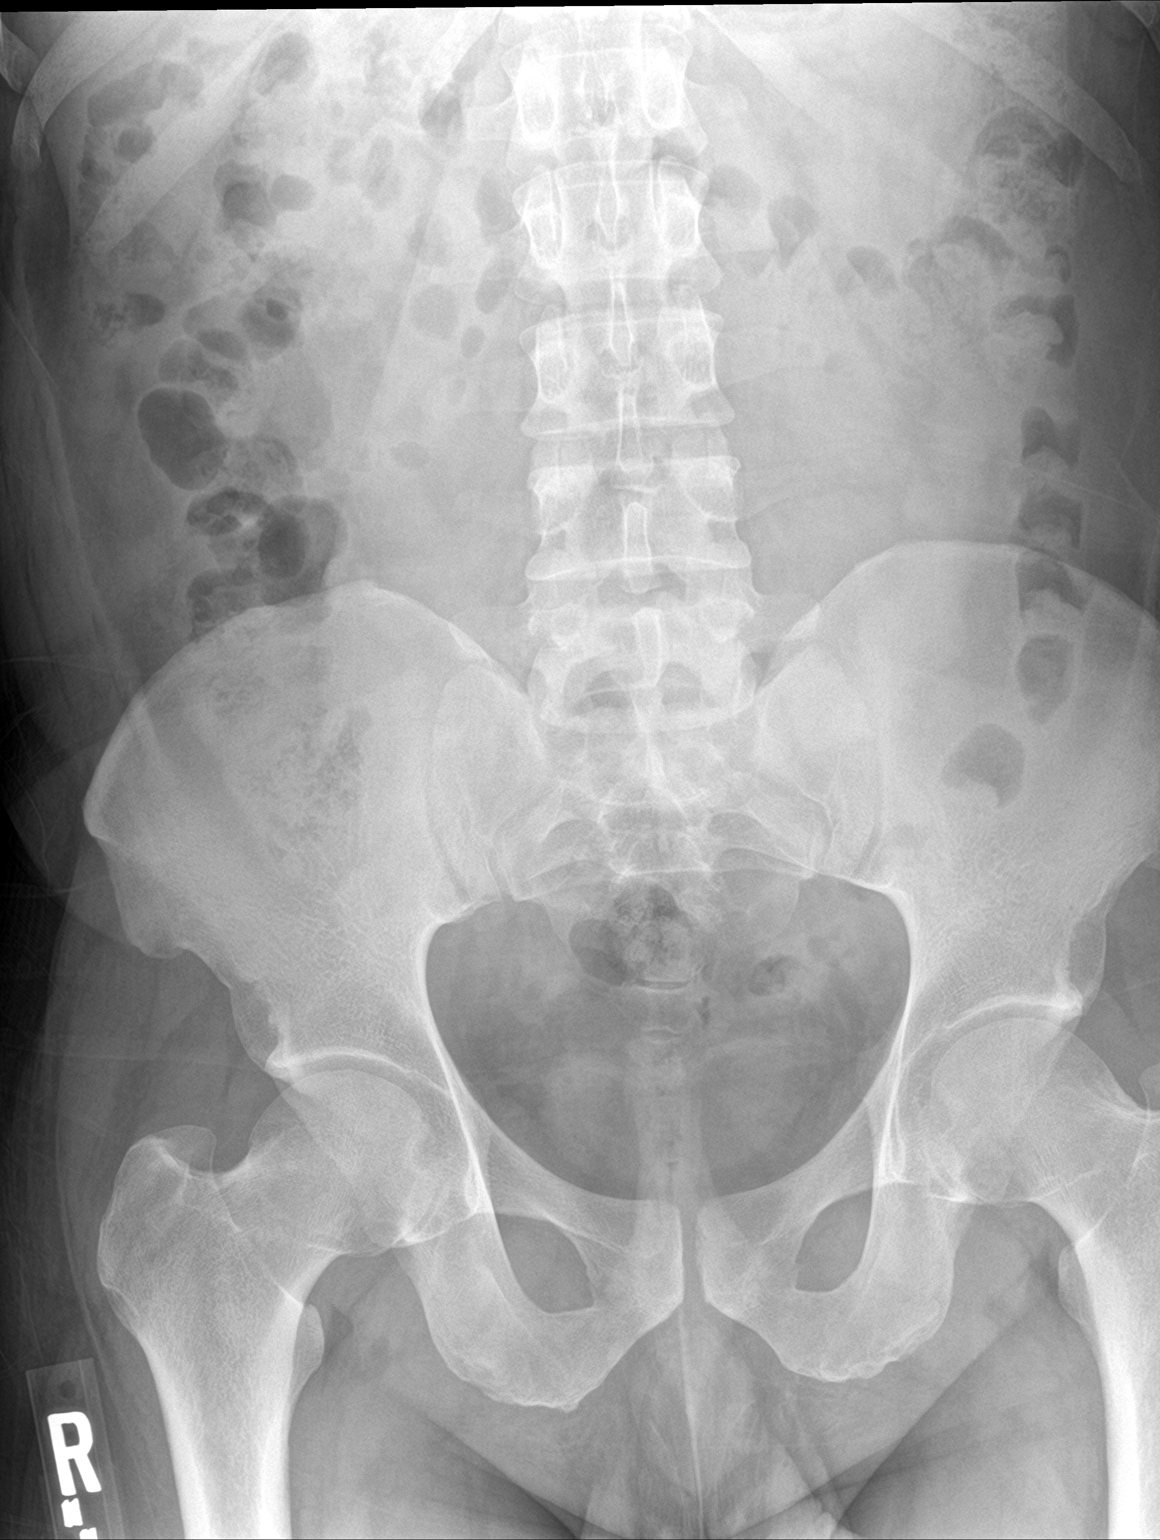

[2 of 2 positions shown; findings below may reference images not displayed]

FINDINGS: Scattered large and small bowel gas is noted. Mild retained fecal
material is seen without obstructive change. No free air is noted.
No abnormal mass or abnormal calcifications are seen. No bony
abnormality is noted.
IMPRESSION: No acute abnormality seen.
